# Patient Record
Sex: Female | Born: 1975
Health system: Southern US, Community
[De-identification: ages and names within clinical notes are randomized; demographics above are authoritative.]

## PROBLEM LIST (undated history)

## (undated) ENCOUNTER — Ambulatory Visit (HOSPITAL_COMMUNITY): Admission: EM | Payer: 59

## (undated) DIAGNOSIS — N926 Irregular menstruation, unspecified: Secondary | ICD-10-CM

## (undated) DIAGNOSIS — K5900 Constipation, unspecified: Secondary | ICD-10-CM

## (undated) DIAGNOSIS — D649 Anemia, unspecified: Secondary | ICD-10-CM

## (undated) DIAGNOSIS — Z789 Other specified health status: Secondary | ICD-10-CM

## (undated) HISTORY — PX: ABDOMINAL HYSTERECTOMY: SHX81

---

## 2004-07-13 ENCOUNTER — Emergency Department: Payer: Self-pay | Admitting: Emergency Medicine

## 2008-05-27 ENCOUNTER — Emergency Department: Payer: Self-pay | Admitting: Emergency Medicine

## 2012-02-24 ENCOUNTER — Emergency Department: Payer: Self-pay | Admitting: Emergency Medicine

## 2012-12-01 ENCOUNTER — Emergency Department: Payer: Self-pay

## 2015-01-23 ENCOUNTER — Ambulatory Visit
Admission: EM | Admit: 2015-01-23 | Discharge: 2015-01-23 | Discharge status: Absent without leave | Payer: BLUE CROSS/BLUE SHIELD

## 2015-03-23 ENCOUNTER — Ambulatory Visit
Admission: EM | Admit: 2015-03-23 | Discharge: 2015-03-23 | Disposition: A | Discharge status: Home / Self Care | Payer: BLUE CROSS/BLUE SHIELD | Attending: Family Medicine | Admitting: Family Medicine

## 2015-03-23 DIAGNOSIS — S39012A Strain of muscle, fascia and tendon of lower back, initial encounter: Secondary | ICD-10-CM

## 2015-03-23 HISTORY — DX: Other specified health status: Z78.9

## 2015-03-23 MED ORDER — CYCLOBENZAPRINE HCL 10 MG PO TABS: 10.0000 mg | ORAL_TABLET | Freq: Three times a day (TID) | ORAL | Status: DC | PRN

## 2015-03-23 NOTE — ED Notes (Signed)
Bilateral lower back pain since yesterday. Pt reports she took a generic OTC pain medication, with some relief. Pt denies urinary sx. Pt denies injury. Pt reports that the pain has gotten up to a 10, but is currently an 8/10.

## 2015-03-23 NOTE — ED Provider Notes (Signed)
CSN: [id number]     Arrival date & time 03/23/15  1856 History   First MD Initiated Contact with Patient 03/23/15 1929     Chief Complaint  Patient presents with  . Back Pain   (Consider location/radiation/quality/duration/timing/severity/associated sxs/prior Treatment) HPI Comments: 39 yo female with a 1 day h/o low back pain, bilateral but left greater than right. Denies any injuries, trauma, falls, fevers, chills, rash, dysuria, numbness/tingling.   The history is provided by the patient.    Past Medical History  Diagnosis Date  . Patient denies medical problems    Past Surgical History  Procedure Laterality Date  . Cesarean section      x 4   No family history on file. History  Substance Use Topics  . Smoking status: Current Every Day Smoker -- 0.25 packs/day for 2 years  . Smokeless tobacco: Never Used  . Alcohol Use: No   OB History    Gravida Para Term Preterm AB TAB SAB Ectopic Multiple Living   5 5             Review of Systems  Allergies  Review of patient's allergies indicates no known allergies.  Home Medications   Prior to Admission medications   Medication Sig Start Date End Date Taking? Authorizing Provider  cyclobenzaprine (FLEXERIL) 10 MG tablet Take 1 tablet (10 mg total) by mouth 3 (three) times daily as needed for muscle spasms. 03/23/15   Norval Gable, MD   BP 125/71 mmHg  Pulse 78  Temp(Src) 98.6 F (37 C) (Oral)  Resp 16  Ht 5\' 4"  (1.626 m)  Wt 175 lb (79.379 kg)  BMI 30.02 kg/m2  SpO2 100%  LMP 02/21/2015 (Approximate) Physical Exam  Constitutional: She appears well-developed and well-nourished. No distress.  Musculoskeletal: She exhibits tenderness. She exhibits no edema.       Lumbar back: She exhibits tenderness and spasm. She exhibits normal range of motion, no bony tenderness, no swelling, no edema, no deformity, no laceration, no pain and normal pulse.  Neurological: She is alert. She has normal reflexes. She exhibits normal  muscle tone.  Skin: Skin is warm and dry. No rash noted. She is not diaphoretic. No erythema.  Nursing note and vitals reviewed.   ED Course  Procedures (including critical care time) Labs Review Labs Reviewed  URINE CULTURE  URINALYSIS COMPLETEWITH MICROSCOPIC ([facility])    Imaging Review No results found.   MDM   1. Lumbar strain, initial encounter    Discharge Medication List as of 03/23/2015  7:47 PM    START taking these medications   Details  cyclobenzaprine (FLEXERIL) 10 MG tablet Take 1 tablet (10 mg total) by mouth 3 (three) times daily as needed for muscle spasms., Starting 03/23/2015, Until Discontinued, Normal      Plan: 1.  diagnosis reviewed with patient 2. rx as per orders; risks, benefits, potential side effects reviewed with patient 3. Recommend supportive treatment with rest, gentle stretching, ice/heat 4. F/u prn if symptoms worsen or don't improve    Norval Gable, MD 03/23/15 1949

## 2016-11-30 ENCOUNTER — Emergency Department (HOSPITAL_COMMUNITY)
Admission: EM | Admit: 2016-11-30 | Discharge: 2016-11-30 | Disposition: A | Discharge status: Home / Self Care | Payer: Self-pay | Attending: Emergency Medicine | Admitting: Emergency Medicine

## 2016-11-30 ENCOUNTER — Emergency Department (HOSPITAL_COMMUNITY): Payer: Self-pay

## 2016-11-30 ENCOUNTER — Encounter (HOSPITAL_COMMUNITY): Payer: Self-pay

## 2016-11-30 DIAGNOSIS — K5900 Constipation, unspecified: Secondary | ICD-10-CM | POA: Insufficient documentation

## 2016-11-30 DIAGNOSIS — Z87891 Personal history of nicotine dependence: Secondary | ICD-10-CM | POA: Insufficient documentation

## 2016-11-30 DIAGNOSIS — R140 Abdominal distension (gaseous): Secondary | ICD-10-CM | POA: Insufficient documentation

## 2016-11-30 LAB — URINALYSIS, ROUTINE W REFLEX MICROSCOPIC
Bilirubin Urine: NEGATIVE
Glucose, UA: NEGATIVE mg/dL
HGB URINE DIPSTICK: NEGATIVE
Ketones, ur: NEGATIVE mg/dL
LEUKOCYTES UA: NEGATIVE
NITRITE: NEGATIVE
PROTEIN: NEGATIVE mg/dL
SPECIFIC GRAVITY, URINE: 1.003 — AB (ref 1.005–1.030)
pH: 8 (ref 5.0–8.0)

## 2016-11-30 LAB — CBC WITH DIFFERENTIAL/PLATELET
BASOS PCT: 0 %
Basophils Absolute: 0 10*3/uL (ref 0.0–0.1)
EOS PCT: 2 %
Eosinophils Absolute: 0.1 10*3/uL (ref 0.0–0.7)
HCT: 34.9 % — ABNORMAL LOW (ref 36.0–46.0)
HEMOGLOBIN: 12 g/dL (ref 12.0–15.0)
LYMPHS PCT: 24 %
Lymphs Abs: 1.1 10*3/uL (ref 0.7–4.0)
MCH: 28.4 pg (ref 26.0–34.0)
MCHC: 34.4 g/dL (ref 30.0–36.0)
MCV: 82.5 fL (ref 78.0–100.0)
MONO ABS: 0.4 10*3/uL (ref 0.1–1.0)
Monocytes Relative: 9 %
Neutro Abs: 2.9 10*3/uL (ref 1.7–7.7)
Neutrophils Relative %: 65 %
Platelets: 131 10*3/uL — ABNORMAL LOW (ref 150–400)
RBC: 4.23 MIL/uL (ref 3.87–5.11)
RDW: 13 % (ref 11.5–15.5)
WBC: 4.6 10*3/uL (ref 4.0–10.5)

## 2016-11-30 LAB — COMPREHENSIVE METABOLIC PANEL
ALBUMIN: 3.4 g/dL — AB (ref 3.5–5.0)
ALK PHOS: 81 U/L (ref 38–126)
ALT: 93 U/L — ABNORMAL HIGH (ref 14–54)
AST: 116 U/L — ABNORMAL HIGH (ref 15–41)
Anion gap: 8 (ref 5–15)
BUN: 5 mg/dL — ABNORMAL LOW (ref 6–20)
CALCIUM: 8.6 mg/dL — AB (ref 8.9–10.3)
CHLORIDE: 106 mmol/L (ref 101–111)
CO2: 24 mmol/L (ref 22–32)
Creatinine, Ser: 0.61 mg/dL (ref 0.44–1.00)
GFR calc non Af Amer: >60 mL/min (ref >60)
GLUCOSE: 112 mg/dL — AB (ref 65–99)
POTASSIUM: 3.9 mmol/L (ref 3.5–5.1)
Sodium: 138 mmol/L (ref 135–145)
Total Bilirubin: 0.6 mg/dL (ref 0.3–1.2)
Total Protein: 6.4 g/dL — ABNORMAL LOW (ref 6.5–8.1)

## 2016-11-30 LAB — I-STAT BETA HCG BLOOD, ED (MC, WL, AP ONLY): I-stat hCG, quantitative: <5 m[IU]/mL (ref <5)

## 2016-11-30 LAB — LIPASE, BLOOD: Lipase: 16 U/L (ref 11–51)

## 2016-11-30 MED ORDER — POLYETHYLENE GLYCOL 3350 17 G PO PACK: 17.0000 g | PACK | Freq: Every day | ORAL | 0 refills | Status: DC

## 2016-11-30 MED ORDER — DOCUSATE SODIUM 250 MG PO CAPS: 250.0000 mg | ORAL_CAPSULE | Freq: Every day | ORAL | 0 refills | Status: DC

## 2016-11-30 MED ORDER — IOPAMIDOL (ISOVUE-300) INJECTION 61%
INTRAVENOUS | Status: AC
  Administered 2016-11-30: 100 mL
  Filled 2016-11-30: qty 100

## 2016-11-30 MED ORDER — MILK AND MOLASSES ENEMA: 1.0000 | Freq: Once | RECTAL | Status: DC

## 2016-11-30 NOTE — ED Provider Notes (Signed)
[facility] DEPT Provider Note   CSN: [id number] Arrival date & time: 11/30/16  0741     History   Chief Complaint Chief Complaint  Patient presents with  . Constipation    HPI Shelley Holden is a 41 y.o. female.  HPI Shelley Holden is a 41 y.o. female with hx of c-sections x4, no other medical problems, presents to ED with complaint of abdominal bloating and constipation. Pt states she has long standing history of constipation. States she has to take a laxative, which she takes once a week, to have a bowel movement. She normal takes 2 tablets of a laxative, name of which she cannot recall, states "some generic over the counter." This week she states she took 4 tablets 2 days ago, took 4 more last night, and drank a bottle of magnesium citrate with no relief. States this morning woke up with abdominal swelling, belching, burping. Denies any pain, states "just pressure." Admits to poor diet over last few weeks. States " I am constantly hungry and have been having a lot of cravings." not sure if she is pregnant. No fever, chills, urinary symptoms.   Past Medical History:  Diagnosis Date  . Patient denies medical problems     There are no active problems to display for this patient.   Past Surgical History:  Procedure Laterality Date  . CESAREAN SECTION     x 4    OB History    Gravida Para Term Preterm AB Living   5 5           SAB TAB Ectopic Multiple Live Births                   Home Medications    Prior to Admission medications   Medication Sig Start Date End Date Taking? Authorizing Provider  cyclobenzaprine (FLEXERIL) 10 MG tablet Take 1 tablet (10 mg total) by mouth 3 (three) times daily as needed for muscle spasms. 03/23/15   Norval Gable, MD    Family History History reviewed. No pertinent family history.  Social History Social History  Substance Use Topics  . Smoking status: Former Smoker    Packs/day: 0.25    Years: 2.00  . Smokeless tobacco: Never  Used  . Alcohol use No     Allergies   Patient has no known allergies.   Review of Systems Review of Systems  Constitutional: Negative for chills and fever.  Respiratory: Negative for cough, chest tightness and shortness of breath.   Cardiovascular: Negative for chest pain, palpitations and leg swelling.  Gastrointestinal: Positive for abdominal distention, abdominal pain and constipation. Negative for blood in stool, diarrhea, nausea and vomiting.  Genitourinary: Negative for dysuria, flank pain and pelvic pain.  Musculoskeletal: Negative for arthralgias, myalgias, neck pain and neck stiffness.  Skin: Negative for rash.  Neurological: Negative for dizziness, weakness and headaches.  All other systems reviewed and are negative.    Physical Exam Updated Vital Signs BP (!) 132/92 (BP Location: Right Arm)   Pulse 94   Temp 97.9 F (36.6 C) (Oral)   Resp 18   Ht 5\' 4"  (1.626 m)   Wt 86.6 kg   SpO2 98%   BMI 32.79 kg/m   Physical Exam  Constitutional: She appears well-developed and well-nourished. No distress.  HENT:  Head: Normocephalic.  Eyes: Conjunctivae are normal.  Neck: Neck supple.  Cardiovascular: Normal rate, regular rhythm and normal heart sounds.   Pulmonary/Chest: Effort normal and breath sounds normal. No respiratory  distress. She has no wheezes. She has no rales.  Abdominal: Soft. There is no tenderness. There is no rebound.  Bowels hyperactive, abdomen is distended but soft  Musculoskeletal: She exhibits no edema.  Neurological: She is alert.  Skin: Skin is warm and dry.  Psychiatric: She has a normal mood and affect. Her behavior is normal.  Nursing note and vitals reviewed.    ED Treatments / Results  Labs (all labs ordered are listed, but only abnormal results are displayed) Labs Reviewed  CBC WITH DIFFERENTIAL/PLATELET - Abnormal; Notable for the following:       Result Value   HCT 34.9 (*)    Platelets 131 (*)    All other components  within normal limits  COMPREHENSIVE METABOLIC PANEL - Abnormal; Notable for the following:    Glucose, Bld 112 (*)    BUN 5 (*)    Calcium 8.6 (*)    Total Protein 6.4 (*)    Albumin 3.4 (*)    AST 116 (*)    ALT 93 (*)    All other components within normal limits  URINALYSIS, ROUTINE W REFLEX MICROSCOPIC - Abnormal; Notable for the following:    Color, Urine COLORLESS (*)    Specific Gravity, Urine 1.003 (*)    All other components within normal limits  LIPASE, BLOOD  I-STAT BETA HCG BLOOD, ED ([facility], [facility], [facility] ONLY)    EKG  EKG Interpretation None       Radiology Ct Abdomen Pelvis W Contrast  Result Date: 11/30/2016 CLINICAL DATA:  Bloating and nausea. EXAM: CT ABDOMEN AND PELVIS WITH CONTRAST TECHNIQUE: Multidetector CT imaging of the abdomen and pelvis was performed using the standard protocol following bolus administration of intravenous contrast. CONTRAST:  160mL ISOVUE-300 IOPAMIDOL (ISOVUE-300) INJECTION 61% COMPARISON:  Abdominal radiographs November 30, 2016 FINDINGS: Lower chest: Lung bases are clear. Hepatobiliary: There is hepatic steatosis. No focal liver lesions are apparent. Gallbladder wall is not appreciably thickened. There is no biliary duct dilatation. Pancreas: No pancreatic mass or inflammatory focus. Spleen: No splenic lesions are evident. Adrenals/Urinary Tract: Adrenals appear normal. Kidneys bilaterally show no evidence of mass or hydronephrosis on either side. There is no renal or ureteral calculus on either side. Urinary bladder is midline with wall thickness within normal limits. Stomach/Bowel: There is moderate fluid and stool throughout the colon. Rectum is somewhat distended with stool and air. Rectal wall is not appreciably thickened. Transverse colon is borderline distended. There is no appreciable small bowel dilatation. There is no bowel wall or mesenteric thickening. There is no evident obstruction or free air. Vascular/Lymphatic: There is no abdominal aortic  aneurysm. No vascular lesions are evident. There is no adenopathy in the abdomen or pelvis. Reproductive: The uterus is anteverted and canted toward the left. The uterus is diffusely enlarged with several areas of enhancement consistent with leiomyomatous change. The largest individual enhancing lesion is seen in the anterior inferior uterus measuring 2.8 x 2.8 x 2.1 cm. There is a focal area of decreased attenuation near the junction of the cervix and uterus, likely a nabothian cyst. This presumed nabothian cyst measures 2 x 2 x 1.3 cm. No extrauterine pelvic mass evident. Other: Appendix appears normal. No ascites or abscess evident in the abdomen or pelvis. Musculoskeletal: No blastic or lytic bone lesions are evident. There is no intramuscular or abdominal wall lesion. IMPRESSION: Fluid and air throughout much of the colon. Rectum mildly distended with stool and air. Borderline colonic distention. Suspect a degree of colitis with  colonic ileus. Colonic wall is not thickened, however. There is no appreciable small bowel wall thickening or appreciable small bowel dilatation. No bowel obstruction is evident. No free air. Appendix appears normal.  No abscess. No renal or ureteral calculus.  No hydronephrosis. Hepatic steatosis without focal liver lesion. Uterus enlarged with apparent leiomyomatous change. Prominent chest and upper cervical nabothian cyst. Electronically Signed   By: Lowella Grip III M.D.   On: 11/30/2016 11:28   Dg Abd 2 Views  Result Date: 11/30/2016 CLINICAL DATA:  Patient has not had bowel movement in 1 week. Pressure on both sides of the abdomen. EXAM: ABDOMEN - 2 VIEW COMPARISON:  None. FINDINGS: Mildly distended abdomen with air-fluid levels in both the small bowel and colon. Rectal gas is present. There does not appear to be a large amount of stool in the colon. No abnormal calcifications are seen. Bones are unremarkable. IMPRESSION: Mildly distended abdomen with scattered air-fluid  levels could represent ileus. No definite obstruction or free air. There does not appear to a large stool burden. Electronically Signed   By: Staci Righter M.D.   On: 11/30/2016 09:36    Procedures Procedures (including critical care time)  Medications Ordered in ED Medications - No data to display   Initial Impression / Assessment and Plan / ED Course  I have reviewed the triage vital signs and the nursing notes.  Pertinent labs & imaging results that were available during my care of the patient were reviewed by me and considered in my medical decision making (see chart for details).     Pt in ED with abdominal distention, burping, belching, constipation, last bowel movement yesterday. Will check labs, get abd series to evaluate for gas pattern and constipation. Pregnancy test ordered.   9:53 AM X-ray showing dilated bowel loops and possible ileus. Patient continues to feel distended and states she is not passing gas. Reassessed with Dr. Rex Kras. Concerning for possible small bowel obstruction. Will get CT abdomen and pelvis for further evaluation.  11:52 AM CT scan with distended colon and stool in the rectum. Question ileus with colitis. Ordered enema, however when I went to reassess patient she stated that she went to the bathroom and had a very large bowel movement and feels much better. She is now passing gas through her rectum. Patient is tolerating oral fluids. Plan to discharge home with Colace and MiraLAX daily. Increase fiber, increase fluids. Follow-up as needed. Return precautions discussed.  Vitals:   11/30/16 0750 11/30/16 0815 11/30/16 0900 11/30/16 1001  BP: (!) 132/92 (!) 134/92 126/90 119/79  Pulse: 94 89 90 88  Resp: 18  18 16   Temp: 97.9 F (36.6 C)     TempSrc: Oral     SpO2: 98% 100% 99% 100%  Weight: 86.6 kg     Height: 5\' 4"  (1.626 m)        Final Clinical Impressions(s) / ED Diagnoses   Final diagnoses:  Abdominal distension  Constipation,  unspecified constipation type    New Prescriptions New Prescriptions   DOCUSATE SODIUM (COLACE) 250 MG CAPSULE    Take 1 capsule (250 mg total) by mouth daily.   POLYETHYLENE GLYCOL (MIRALAX) PACKET    Take 17 g by mouth daily.     Jeannett Senior, PA-C 11/30/16 [place], MD 11/30/16 [phone]

## 2016-11-30 NOTE — ED Notes (Signed)
Pt returned from CT °

## 2016-11-30 NOTE — Discharge Instructions (Signed)
Increase fiber and fluids in your diet. Take miralax and colace daily to regulate your bowel movements. Follow up with gastroenterologist.

## 2016-11-30 NOTE — ED Triage Notes (Signed)
Pt states she has hx of constipation. She reports constipation X2 days, using laxatives without improvement. Pt states she woke up this morning with abd pain and bloating.

## 2016-11-30 NOTE — ED Notes (Signed)
Patient transported to X-ray 

## 2016-11-30 NOTE — ED Notes (Signed)
MD at bedside. 

## 2017-03-25 ENCOUNTER — Emergency Department (HOSPITAL_COMMUNITY)
Admission: EM | Admit: 2017-03-25 | Discharge: 2017-03-25 | Disposition: A | Discharge status: Home / Self Care | Payer: No Typology Code available for payment source | Attending: Physician Assistant | Admitting: Physician Assistant

## 2017-03-25 ENCOUNTER — Encounter (HOSPITAL_COMMUNITY): Payer: Self-pay | Admitting: *Deleted

## 2017-03-25 ENCOUNTER — Emergency Department (HOSPITAL_COMMUNITY): Payer: No Typology Code available for payment source

## 2017-03-25 DIAGNOSIS — Y929 Unspecified place or not applicable: Secondary | ICD-10-CM | POA: Insufficient documentation

## 2017-03-25 DIAGNOSIS — Y9389 Activity, other specified: Secondary | ICD-10-CM | POA: Insufficient documentation

## 2017-03-25 DIAGNOSIS — M7918 Myalgia, other site: Secondary | ICD-10-CM

## 2017-03-25 DIAGNOSIS — M79642 Pain in left hand: Secondary | ICD-10-CM | POA: Diagnosis present

## 2017-03-25 DIAGNOSIS — M791 Myalgia: Secondary | ICD-10-CM | POA: Insufficient documentation

## 2017-03-25 DIAGNOSIS — Z79899 Other long term (current) drug therapy: Secondary | ICD-10-CM | POA: Diagnosis not present

## 2017-03-25 DIAGNOSIS — R51 Headache: Secondary | ICD-10-CM | POA: Insufficient documentation

## 2017-03-25 DIAGNOSIS — Y999 Unspecified external cause status: Secondary | ICD-10-CM | POA: Diagnosis not present

## 2017-03-25 MED ORDER — IBUPROFEN 400 MG PO TABS
600.0000 mg | ORAL_TABLET | Freq: Once | ORAL | Status: AC
  Administered 2017-03-25: 600 mg via ORAL
  Filled 2017-03-25: qty 1

## 2017-03-25 MED ORDER — IBUPROFEN 600 MG PO TABS: 600.0000 mg | ORAL_TABLET | Freq: Four times a day (QID) | ORAL | 0 refills | Status: DC | PRN

## 2017-03-25 MED ORDER — METHOCARBAMOL 500 MG PO TABS
500.0000 mg | ORAL_TABLET | Freq: Once | ORAL | Status: AC
  Administered 2017-03-25: 500 mg via ORAL
  Filled 2017-03-25: qty 1

## 2017-03-25 MED ORDER — METHOCARBAMOL 500 MG PO TABS: 500.0000 mg | ORAL_TABLET | Freq: Two times a day (BID) | ORAL | 0 refills | Status: DC

## 2017-03-25 NOTE — ED Notes (Signed)
PA at the bedside.
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
## 2018-11-16 ENCOUNTER — Ambulatory Visit (HOSPITAL_COMMUNITY)
Admission: EM | Admit: 2018-11-16 | Discharge: 2018-11-16 | Disposition: A | Discharge status: Home / Self Care | Payer: 59 | Attending: Emergency Medicine | Admitting: Emergency Medicine

## 2018-11-16 ENCOUNTER — Other Ambulatory Visit: Payer: Self-pay

## 2018-11-16 ENCOUNTER — Encounter (HOSPITAL_COMMUNITY): Payer: Self-pay

## 2018-11-16 DIAGNOSIS — B029 Zoster without complications: Secondary | ICD-10-CM

## 2018-11-16 MED ORDER — VALACYCLOVIR HCL 1 G PO TABS: 1000.0000 mg | ORAL_TABLET | Freq: Three times a day (TID) | ORAL | 0 refills | Status: AC

## 2018-11-16 MED ORDER — TETRACAINE HCL 0.5 % OP SOLN
OPHTHALMIC | Status: AC
  Filled 2018-11-16: qty 4

## 2018-11-16 MED ORDER — ERYTHROMYCIN 5 MG/GM OP OINT: TOPICAL_OINTMENT | OPHTHALMIC | 0 refills | Status: DC

## 2018-11-16 NOTE — ED Triage Notes (Signed)
Pt cc left eye swelling . Pt states she wear eyelashes and she dose not think that's the problem.

## 2018-11-16 NOTE — Discharge Instructions (Addendum)
Warm compresses.  Follow-up with Dr. Kathlen Mody tomorrow at 8 AM.  Call the office, [facility] to confirm your appointment tomorrow.  Phone number is [phone].

## 2018-11-16 NOTE — ED Provider Notes (Signed)
HPI  SUBJECTIVE:  Shelley Holden is a 43 y.o. female who presents with 1 week of left eye itching, 2 days of upper and lower eyelid swelling.  States that she wears false eyelashes on her upper lids, but has been using the same adhesive for the past 10 years without any problem.  She reports pain in the lateral corner of her left eye and discharge.  She reports a foreign body sensation.  No visual changes, eye pain, URI symptoms, headaches.  No other exposure to chemicals or new cosmetics.  No allergy symptoms.  She does not wear glasses or contacts.  No photophobia, facial rash, although she reports soreness, tenderness along her left cheek.  She has never had symptoms like this before.  She has tried warm compresses with improvement of symptoms.  No other aggravating or alleviating factors.  She has a past medical history of chickenpox, no history of shingles: None diabetes.  LMP: Irregular.  Denies the possibility being pregnant.  PMD: She has not yet established care with a primary care physician but plans on doing so    Past Medical History:  Diagnosis Date  . Patient denies medical problems     Past Surgical History:  Procedure Laterality Date  . CESAREAN SECTION     x 4    History reviewed. No pertinent family history.  Social History   Tobacco Use  . Smoking status: Former Smoker    Packs/day: 0.25    Years: 2.00    Pack years: 0.50  . Smokeless tobacco: Never Used  Substance Use Topics  . Alcohol use: No  . Drug use: No    No current facility-administered medications for this encounter.   Current Outpatient Medications:  .  erythromycin ophthalmic ointment, 1 cm ribbon to affected eyelid qid x 10 days, Disp: 5 g, Rfl: 0 .  valACYclovir (VALTREX) 1000 MG tablet, Take 1 tablet (1,000 mg total) by mouth 3 (three) times daily for 7 days., Disp: 21 tablet, Rfl: 0  No Known Allergies   ROS  As noted in HPI.   Physical Exam  Pulse 96   Temp 98.2 F (36.8 C) (Oral)    Resp 18   Wt 95.3 kg   SpO2 100%   BMI 36.05 kg/m   Constitutional: Well developed, well nourished, no acute distress Eyes: PERRLA, EOMI, conjunctiva normal bilaterally.  No direct or consensual photophobia.  Positive upper and lower eyelid swelling.  Positive tender grouped vesicular rash lateral corner lower eyelid with purulent drainage.  No dendrites seen on fluorescein exam.  No foreign body seen on magnification or lid eversion.      HENT: Normocephalic, atraumatic,mucus membranes moist Respiratory: Normal inspiratory effort Cardiovascular: Normal rate GI: nondistended skin: No other facial rash, but tenderness over the cheek.  Skin intact Musculoskeletal: no deformities Neurologic: Alert & oriented x 3, no focal neuro deficits Psychiatric: Speech and behavior appropriate   ED Course   Medications - No data to display  No orders of the defined types were placed in this encounter.   No results found for this or any previous visit (from the past 24 hour(s)). No results found.  ED Clinical Impression  Herpes zoster without complication   ED Assessment/Plan  Presentation concerning for shingles.  Discussed with Dr. Kathlen Mody, ophthalmologist on call, due to the proximity of the rash to the cornea.  There is no evidence of corneal involvement today.Marland Kitchen  He recommends erythromycin ointment in addition to Valtrex.  He will see  the patient tomorrow at 8 AM.  Discussed this with patient.  She states that she can go to the appointment.  Discussed  MDM, treatment plan, and plan for follow-up with patient. Discussed sn/sx that should prompt return to the ED. patient agrees with plan.   Meds ordered this encounter  Medications  . valACYclovir (VALTREX) 1000 MG tablet    Sig: Take 1 tablet (1,000 mg total) by mouth 3 (three) times daily for 7 days.    Dispense:  21 tablet    Refill:  0  . erythromycin ophthalmic ointment    Sig: 1 cm ribbon to affected eyelid qid x 10 days     Dispense:  5 g    Refill:  0    *This clinic note was created using [employer]. Therefore, there may be occasional mistakes despite careful proofreading.   ?    Melynda Ripple, MD 11/17/18 [phone]

## 2019-08-02 ENCOUNTER — Emergency Department (HOSPITAL_COMMUNITY): Payer: 59

## 2019-08-02 ENCOUNTER — Encounter (HOSPITAL_COMMUNITY): Payer: Self-pay | Admitting: Emergency Medicine

## 2019-08-02 ENCOUNTER — Emergency Department (HOSPITAL_COMMUNITY)
Admission: EM | Admit: 2019-08-02 | Discharge: 2019-08-02 | Disposition: A | Discharge status: Home / Self Care | Payer: 59 | Attending: Emergency Medicine | Admitting: Emergency Medicine

## 2019-08-02 DIAGNOSIS — S0990XA Unspecified injury of head, initial encounter: Secondary | ICD-10-CM | POA: Diagnosis not present

## 2019-08-02 DIAGNOSIS — Y999 Unspecified external cause status: Secondary | ICD-10-CM | POA: Insufficient documentation

## 2019-08-02 DIAGNOSIS — Y929 Unspecified place or not applicable: Secondary | ICD-10-CM | POA: Diagnosis not present

## 2019-08-02 DIAGNOSIS — Y939 Activity, unspecified: Secondary | ICD-10-CM | POA: Diagnosis not present

## 2019-08-02 DIAGNOSIS — S161XXA Strain of muscle, fascia and tendon at neck level, initial encounter: Secondary | ICD-10-CM | POA: Diagnosis not present

## 2019-08-02 DIAGNOSIS — R519 Headache, unspecified: Secondary | ICD-10-CM | POA: Diagnosis present

## 2019-08-02 DIAGNOSIS — Z87891 Personal history of nicotine dependence: Secondary | ICD-10-CM | POA: Diagnosis not present

## 2019-08-02 MED ORDER — METHOCARBAMOL 500 MG PO TABS: 500.0000 mg | ORAL_TABLET | Freq: Three times a day (TID) | ORAL | 0 refills | Status: DC | PRN

## 2019-08-02 MED ORDER — NAPROXEN 500 MG PO TABS: 500.0000 mg | ORAL_TABLET | Freq: Two times a day (BID) | ORAL | 0 refills | Status: DC | PRN

## 2019-08-02 MED ORDER — DICLOFENAC SODIUM 1 % EX GEL: 2.0000 g | Freq: Four times a day (QID) | CUTANEOUS | 0 refills | Status: DC | PRN
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
## 2019-08-02 NOTE — ED Notes (Signed)
Pt verbalized understanding of discharge instructions. Follow up care and prescriptions reviewed, pt had no further questions. 

## 2019-08-02 NOTE — ED Notes (Signed)
Patient transported to CT 
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
## 2020-12-03 ENCOUNTER — Other Ambulatory Visit: Payer: Self-pay

## 2020-12-03 ENCOUNTER — Emergency Department (HOSPITAL_COMMUNITY)
Admission: EM | Admit: 2020-12-03 | Discharge: 2020-12-03 | Disposition: A | Discharge status: Home / Self Care | Payer: 59 | Attending: Emergency Medicine | Admitting: Emergency Medicine

## 2020-12-03 ENCOUNTER — Encounter (HOSPITAL_COMMUNITY): Payer: Self-pay

## 2020-12-03 DIAGNOSIS — Z87891 Personal history of nicotine dependence: Secondary | ICD-10-CM | POA: Insufficient documentation

## 2020-12-03 DIAGNOSIS — N938 Other specified abnormal uterine and vaginal bleeding: Secondary | ICD-10-CM | POA: Diagnosis not present

## 2020-12-03 DIAGNOSIS — D500 Iron deficiency anemia secondary to blood loss (chronic): Secondary | ICD-10-CM | POA: Insufficient documentation

## 2020-12-03 DIAGNOSIS — N939 Abnormal uterine and vaginal bleeding, unspecified: Secondary | ICD-10-CM | POA: Diagnosis present

## 2020-12-03 LAB — CBC
HCT: 30.2 % — ABNORMAL LOW (ref 36.0–46.0)
Hemoglobin: 9.1 g/dL — ABNORMAL LOW (ref 12.0–15.0)
MCH: 23.6 pg — ABNORMAL LOW (ref 26.0–34.0)
MCHC: 30.1 g/dL (ref 30.0–36.0)
MCV: 78.4 fL — ABNORMAL LOW (ref 80.0–100.0)
Platelets: 208 10*3/uL (ref 150–400)
RBC: 3.85 MIL/uL — ABNORMAL LOW (ref 3.87–5.11)
RDW: 15.4 % (ref 11.5–15.5)
WBC: 5.8 10*3/uL (ref 4.0–10.5)
nRBC: 0 % (ref 0.0–0.2)

## 2020-12-03 LAB — BASIC METABOLIC PANEL
Anion gap: 7 (ref 5–15)
BUN: 6 mg/dL (ref 6–20)
CO2: 23 mmol/L (ref 22–32)
Calcium: 8.5 mg/dL — ABNORMAL LOW (ref 8.9–10.3)
Chloride: 109 mmol/L (ref 98–111)
Creatinine, Ser: 0.67 mg/dL (ref 0.44–1.00)
GFR, Estimated: >60 mL/min (ref >60)
Glucose, Bld: 82 mg/dL (ref 70–99)
Potassium: 3.8 mmol/L (ref 3.5–5.1)
Sodium: 139 mmol/L (ref 135–145)

## 2020-12-03 LAB — I-STAT BETA HCG BLOOD, ED (MC, WL, AP ONLY): I-stat hCG, quantitative: <5 m[IU]/mL (ref <5)

## 2020-12-03 MED ORDER — MEDROXYPROGESTERONE ACETATE 10 MG PO TABS: 10.0000 mg | ORAL_TABLET | Freq: Every day | ORAL | 0 refills | Status: DC

## 2020-12-03 MED ORDER — NAPROXEN 500 MG PO TABS: 500.0000 mg | ORAL_TABLET | Freq: Two times a day (BID) | ORAL | 0 refills | Status: DC

## 2020-12-03 MED ORDER — FERROUS SULFATE 325 (65 FE) MG PO TABS: 325.0000 mg | ORAL_TABLET | Freq: Every day | ORAL | 0 refills | Status: DC

## 2020-12-03 NOTE — ED Notes (Signed)
Pt couldn't complete orthostatic vitals standing up pt felt dizzy and nauseous

## 2020-12-03 NOTE — Discharge Instructions (Signed)
Please go to your appointment OB/GYN tomorrow, as scheduled.  You have anemia with hemoglobin of 9.1, down from 12.0 in labs obtained 4 years ago.  Suspect that this is related to chronic blood loss due to your irregular and heavy menses.  You uterus was palpable and I am concerned that there may be a structural cause for your recent heavy bleeding.  You may need ultrasound for further delineation.  Please take the ferrous sulfate, Provera, and naproxen, as prescribed.  Return to the ED or seek immediate medical attention should you experience any new or worsening symptoms.

## 2020-12-03 NOTE — ED Triage Notes (Signed)
Patient reports that she has had vaginal bleeding x 2 weeks with clots.

## 2020-12-03 NOTE — ED Provider Notes (Signed)
[facility] DEPT Provider Note   CSN: [id number] Arrival date & time: 12/03/20  1224     History Chief Complaint  Patient presents with  . Vaginal Bleeding    Shelley Holden is a 45 y.o. female with no relevant past medical history presents the ED with a 2-week history of vaginal bleeding.  On my examination, patient reports that she has a history of irregular menses ever since she had an IUD removed 5 years ago.  However, she has never had heavy bleeding to this extent.  She began with light bleeding a couple weeks ago, but has been progressively worsening with heavy menses x1 week.  She states that she feels mildly lightheaded upon standing and generally fatigued.  Denies any history of prior.  She states that she has already managed to schedule appointment with OB/GYN for tomorrow at 3 PM with [facility].  She denies any recent illness or infection, fevers or chills, abdominal pain, melena or hematochezia, dyspareunia, postcoital bleeding, vaginal discharge, or other symptoms.  She has been married x7 years, low suspicion for STI.  HPI     Past Medical History:  Diagnosis Date  . Patient denies medical problems     There are no problems to display for this patient.   Past Surgical History:  Procedure Laterality Date  . CESAREAN SECTION     x 4     OB History    Gravida  5   Para  5   Term      Preterm      AB      Living        SAB      IAB      Ectopic      Multiple      Live Births              Family History  Problem Relation Age of Onset  . Cancer Father     Social History   Tobacco Use  . Smoking status: Former Smoker    Packs/day: 0.25    Years: 2.00    Pack years: 0.50    Types: Cigarettes  . Smokeless tobacco: Never Used  Vaping Use  . Vaping Use: Every day  . Substances: Flavoring  Substance Use Topics  . Alcohol use: No  . Drug use: No    Home Medications Prior to  Admission medications   Medication Sig Start Date End Date Taking? Authorizing Provider  ferrous sulfate 325 (65 FE) MG tablet Take 1 tablet (325 mg total) by mouth daily. 12/03/20  Yes Corena Herter, PA-C  medroxyPROGESTERone (PROVERA) 10 MG tablet Take 1 tablet (10 mg total) by mouth daily. 12/03/20  Yes Corena Herter, PA-C  naproxen (NAPROSYN) 500 MG tablet Take 1 tablet (500 mg total) by mouth 2 (two) times daily. 12/03/20  Yes Corena Herter, PA-C  diclofenac Sodium (VOLTAREN) 1 % GEL Apply 2 g topically 4 (four) times daily as needed. 08/02/19   Long, Wonda Olds, MD  erythromycin ophthalmic ointment 1 cm ribbon to affected eyelid qid x 10 days 11/16/18   Melynda Ripple, MD  methocarbamol (ROBAXIN) 500 MG tablet Take 1 tablet (500 mg total) by mouth every 8 (eight) hours as needed for muscle spasms. 08/02/19   Long, Wonda Olds, MD    Allergies    Patient has no known allergies.  Review of Systems   Review of Systems  All other systems reviewed and are  negative.   Physical Exam Updated Vital Signs BP (!) 150/89   Pulse 93   Temp 98.9 F (37.2 C) (Oral)   Resp 20   Ht 5\' 4"  (1.626 m)   Wt 95.3 kg   LMP 12/03/2020   SpO2 100%   BMI 36.05 kg/m   Physical Exam Vitals and nursing note reviewed. Exam conducted with a chaperone present.  Constitutional:      General: She is not in acute distress.    Appearance: She is not toxic-appearing.  HENT:     Head: Normocephalic and atraumatic.  Eyes:     General: No scleral icterus.    Conjunctiva/sclera: Conjunctivae normal.     Comments: No significant pallor.  Cardiovascular:     Rate and Rhythm: Normal rate.     Pulses: Normal pulses.     Comments: Normal rate on my exam. Pulmonary:     Effort: Pulmonary effort is normal. No respiratory distress.  Abdominal:     General: Abdomen is flat. There is no distension.     Palpations: Abdomen is soft.     Tenderness: There is no abdominal tenderness. There is no guarding.      Comments: Soft, nondistended.  Mildly enlarged and palpable uterus.  No tenderness.  No overlying skin changes.  Genitourinary:    Comments: Patient deferred due to OB/GYN appointment tomorrow. Skin:    General: Skin is dry.     Coloration: Skin is not pale.  Neurological:     Mental Status: She is alert and oriented to person, place, and time.     GCS: GCS eye subscore is 4. GCS verbal subscore is 5. GCS motor subscore is 6.  Psychiatric:        Mood and Affect: Mood normal.        Behavior: Behavior normal.        Thought Content: Thought content normal.     ED Results / Procedures / Treatments   Labs (all labs ordered are listed, but only abnormal results are displayed) Labs Reviewed  CBC - Abnormal; Notable for the following components:      Result Value   RBC 3.85 (*)    Hemoglobin 9.1 (*)    HCT 30.2 (*)    MCV 78.4 (*)    MCH 23.6 (*)    All other components within normal limits  BASIC METABOLIC PANEL - Abnormal; Notable for the following components:   Calcium 8.5 (*)    All other components within normal limits  I-STAT BETA HCG BLOOD, ED ([facility], [facility], [facility] ONLY)    EKG None  Radiology No results found.  Procedures Procedures   Medications Ordered in ED Medications - No data to display  ED Course  I have reviewed the triage vital signs and the nursing notes.  Pertinent labs & imaging results that were available during my care of the patient were reviewed by me and considered in my medical decision making (see chart for details).    MDM Rules/Calculators/A&P                          Shelley Holden was evaluated in Emergency Department on 12/03/2020 for the symptoms described in the history of present illness. She was evaluated in the context of the global COVID-19 pandemic, which necessitated consideration that the patient might be at risk for infection with the SARS-CoV-2 virus that causes COVID-19. Institutional protocols and algorithms that pertain to the  evaluation  of patients at risk for COVID-19 are in a state of rapid change based on information released by regulatory bodies including the CDC and federal and state organizations. These policies and algorithms were followed during the patient's care in the ED.  I personally reviewed patient's medical chart and all notes from triage and staff during today's encounter. I have also ordered and reviewed all labs and imaging that I felt to be medically necessary in the evaluation of this patient's complaints and with consideration of their physical exam. If needed, translation services were available and utilized.    Unclear as to whether or not her dysfunctional uterine bleeding is hormonal or due to structural abnormalities such as fibroids or adenomyosis.  There was a mildly enlarged palpable uterus on my abdominal exam.  No peritoneal signs.  She denied any associated tenderness or complaints of abdominal pain.  She also denies any postcoital bleeding or dyspareunia, low suspicion for traumatic laceration.  She states that she is in a committed relationship x7 years and has low suspicion for STI.  Denies any recent pelvic discharge.  She denies any personal family history of coagulopathies and there is no petechiae or pallor on my physical exam.  Will obtain CBC and BMP in addition to orthostatic vital signs given that she reports lightheadedness upon standing.  She declines pelvic exam here in the ED given that she just found out that she has an appointment with OB/GYN tomorrow at 3 PM with [facility].  I considered pelvic ultrasound here in the ED to evaluate for structural cause of her dysfunctional uterine bleeding, but she states that she can simply wait until she sees OB/GYN tomorrow.  Will treat with Provera 10 mg daily x10 to 14 days as well as Naproxen 500 mg BID as NSAIDs have been shown to decrease cramping and bleeding by 50%.  Patient with anemia with hemoglobin of 9.1,  representing a three-point drop when compared to labs obtained a few years ago.  This is possibly representative of symptomatic anemia, however patient has excellent outpatient follow-up and is not a transfusion candidate.  Microcytic anemia with MCV of 78.4, likely iron deficient due to blood loss.  She is hemodynamically stable.  Good outpatient follow-up.  Strict ED return precautions discussed.  All of the evaluation and work-up results were discussed with the patient and any family at bedside. They were provided opportunity to ask any additional questions and have none at this time. They have expressed understanding of verbal discharge instructions as well as return precautions and are agreeable to the plan.    Final Clinical Impression(s) / ED Diagnoses Final diagnoses:  Dysfunctional uterine bleeding  Iron deficiency anemia due to chronic blood loss    Rx / DC Orders ED Discharge Orders         Ordered    naproxen (NAPROSYN) 500 MG tablet  2 times daily        12/03/20 1629    medroxyPROGESTERone (PROVERA) 10 MG tablet  Daily        12/03/20 1629    ferrous sulfate 325 (65 FE) MG tablet  Daily        12/03/20 1629           Reita Chard 12/03/20 1631    Carmin Muskrat, MD 12/04/20 2354

## 2020-12-18 ENCOUNTER — Encounter (HOSPITAL_BASED_OUTPATIENT_CLINIC_OR_DEPARTMENT_OTHER): Payer: Self-pay | Admitting: Obstetrics and Gynecology

## 2020-12-18 ENCOUNTER — Other Ambulatory Visit: Payer: Self-pay

## 2020-12-18 NOTE — Progress Notes (Signed)
Spoke w/ via phone for pre-op interview---pt Lab needs dos----t & s, urine preg               Lab results------none COVID test -----12-21-2020 845 am Arrive at -------530 am 12-25-2020 NPO after MN NO Solid Food.  Clear liquids from MN until---430 am then npo Med rec completed Medications to take morning of surgery -----none Diabetic medication -----n/a Patient instructed to bring photo id and insurance card day of surgery Patient aware to have Driver (ride ) / caregiver spouse Larene Beach will drop pt off cell [phone]   for 24 hours after surgery  Patient Special Instructions -----none Pre-Op special Istructions -----none Patient verbalized understanding of instructions that were given at this phone interview. Patient denies shortness of breath, chest pain, fever, cough at this phone interview.

## 2020-12-21 ENCOUNTER — Other Ambulatory Visit (HOSPITAL_COMMUNITY)
Admission: RE | Admit: 2020-12-21 | Discharge: 2020-12-21 | Disposition: A | Discharge status: Home / Self Care | Payer: 59 | Source: Ambulatory Visit | Attending: Obstetrics and Gynecology | Admitting: Obstetrics and Gynecology

## 2020-12-21 DIAGNOSIS — Z20822 Contact with and (suspected) exposure to covid-19: Secondary | ICD-10-CM | POA: Insufficient documentation

## 2020-12-21 DIAGNOSIS — Z01812 Encounter for preprocedural laboratory examination: Secondary | ICD-10-CM | POA: Insufficient documentation

## 2020-12-21 LAB — SARS CORONAVIRUS 2 (TAT 6-24 HRS): SARS Coronavirus 2: NEGATIVE

## 2020-12-24 NOTE — Anesthesia Preprocedure Evaluation (Addendum)
Anesthesia Evaluation  Patient identified by MRN, date of birth, ID band Patient awake    Reviewed: Allergy & Precautions, NPO status , Patient's Chart, lab work & pertinent test results  Airway Mallampati: II  TM Distance: >3 FB Neck ROM: Full    Dental  (+) Teeth Intact   Pulmonary neg pulmonary ROS, former smoker,    Pulmonary exam normal        Cardiovascular negative cardio ROS   Rhythm:Regular Rate:Normal     Neuro/Psych negative neurological ROS  negative psych ROS   GI/Hepatic negative GI ROS, Neg liver ROS,   Endo/Other  negative endocrine ROS  Renal/GU negative Renal ROS  Female GU complaint AUB    Musculoskeletal negative musculoskeletal ROS (+)   Abdominal (+)  Abdomen: soft. Bowel sounds: normal.  Peds  Hematology  (+) anemia ,   Anesthesia Other Findings   Reproductive/Obstetrics negative OB ROS                            Anesthesia Physical Anesthesia Plan  ASA: II  Anesthesia Plan: General   Post-op Pain Management:    Induction: Intravenous  PONV Risk Score and Plan: 3 and Ondansetron, Dexamethasone, Midazolam and Treatment may vary due to age or medical condition  Airway Management Planned: Mask and LMA  Additional Equipment: None  Intra-op Plan:   Post-operative Plan: Extubation in OR  Informed Consent: I have reviewed the patients History and Physical, chart, labs and discussed the procedure including the risks, benefits and alternatives for the proposed anesthesia with the patient or authorized representative who has indicated his/her understanding and acceptance.     Dental advisory given  Plan Discussed with:   Anesthesia Plan Comments: (Lab Results      Component                Value               Date                      WBC                      5.8                 12/03/2020                HGB                      9.1 (L)              12/03/2020                HCT                      30.2 (L)            12/03/2020                MCV                      78.4 (L)            12/03/2020                PLT                      208  12/03/2020           Lab Results      Component                Value               Date                      NA                       139                 12/03/2020                K                        3.8                 12/03/2020                CO2                      23                  12/03/2020                GLUCOSE                  82                  12/03/2020                BUN                      6                   12/03/2020                CREATININE               0.67                12/03/2020                CALCIUM                  8.5 (L)             12/03/2020                GFRNONAA                 >60                 12/03/2020                GFRAA                    >60                 11/30/2016          )       Anesthesia Quick Evaluation

## 2020-12-24 NOTE — H&P (Signed)
Shelley Holden is an 45 y.o. female presenting for scheduled surgery  Pertinent Gynecological History: Menses: flow is excessive with use of 5-6 pads or tampons on heaviest days Bleeding: dysfunctional uterine bleeding Contraception: abstinence DES exposure: denies Blood transfusions: none Sexually transmitted diseases: no past history Previous GYN Procedures: none  Last mammogram: never had Last pap: normal Date: 12/10/20 OB History: G5, P5006   Menstrual History: Menarche age: early teens Patient's last menstrual period was 11/06/2020.    Past Medical History:  Diagnosis Date  . Anemia   . Constipation   . Irregular bleeding     Past Surgical History:  Procedure Laterality Date  . CESAREAN SECTION     x 4    Family History  Problem Relation Age of Onset  . Cancer Father     Social History:  reports that she has quit smoking. Her smoking use included cigarettes. She has a 0.50 pack-year smoking history. She has never used smokeless tobacco. She reports that she does not drink alcohol and does not use drugs.  Allergies: No Known Allergies  No medications prior to admission.    Review of Systems  Constitutional: Negative for chills and fever.  Respiratory: Negative for shortness of breath.   Cardiovascular: Negative for chest pain, palpitations and leg swelling.  Gastrointestinal: Negative for abdominal pain, nausea and vomiting.  Genitourinary: Positive for pelvic pain and vaginal bleeding.  Neurological: Negative for dizziness, weakness and headaches.  Psychiatric/Behavioral: Negative for suicidal ideas.    Height 5\' 4"  (1.626 m), weight 93.9 kg, last menstrual period 11/06/2020. Physical Exam *Constitutional General Appearance: healthy-appearing, overweight  *Cardiovascular Auscultation: RRR, no murmur Peripheral Vascular: no edema, pedal pulses intact  *Respiratory Auscultation: clear to auscultation, no wheezing, no rales/crackles, no  rhonchi Inspection: normal, normal respiratory rate  *Gastrointestinal Inspection/Palpation/Auscultation: non-distended, no tenderness, no rebound, no guarding, soft, normal bowel sounds, abdomen: incision Pfannenstiel, uterus palpated __ weeks (two clear incisions, palpable scarring beneath but well-healed epidermis. Mildly tender fundus suprapubic (10-11 wks))  *Musculoskeletal Legs: no palpable cords, no calf tenderness  *Skin Appearance: no rashes, no lesions  *Neurological System Impressions: motor: no deficits, sensory: no deficits  *Psychiatric Mood and Affect: active and alert No results found for this or any previous visit (from the past 24 hour(s)).  No results found.    Assessment/Plan: [id number] presents for scheduled Hysteroscopy D&C w/ Myosure and Mirena IUD placement. H/o csx x2, anemia 2/2 AUB, BMI 36, otherwise uncomplicated history -TVUS 3/29 c/w adenomyosis and possible endometrial polyp -Discussed surgical options of hysteroscopy D&C Myosure with mirena placement vs hysterectomy. In end, patient elects for hysteroscopy D&C w/ Myosure followed by Mirena IUD placement  The patient was informed of the risks and benefits of a hysteroscopy with dilation and curettage. Risks included but were not limited to bleeding, infections, injury to the vulva, vagina or cervix, or uterine perforation. If concern for latter, may proceed with diagnostic laparoscopy for evaluation of injury which may require surgical repair. Patient understands and is amenable.    Melida Quitter Shelley Holden 12/24/2020, 4:26 PM

## 2020-12-25 ENCOUNTER — Ambulatory Visit (HOSPITAL_BASED_OUTPATIENT_CLINIC_OR_DEPARTMENT_OTHER)
Admission: RE | Admit: 2020-12-25 | Discharge: 2020-12-25 | Disposition: A | Discharge status: Home / Self Care | Payer: 59 | Attending: Obstetrics and Gynecology | Admitting: Obstetrics and Gynecology

## 2020-12-25 ENCOUNTER — Encounter (HOSPITAL_BASED_OUTPATIENT_CLINIC_OR_DEPARTMENT_OTHER)
Admission: RE | Disposition: A | Discharge status: Home / Self Care | Payer: Self-pay | Source: Home / Self Care | Attending: Obstetrics and Gynecology

## 2020-12-25 ENCOUNTER — Ambulatory Visit (HOSPITAL_BASED_OUTPATIENT_CLINIC_OR_DEPARTMENT_OTHER): Payer: 59 | Admitting: Anesthesiology

## 2020-12-25 ENCOUNTER — Other Ambulatory Visit: Payer: Self-pay

## 2020-12-25 ENCOUNTER — Encounter (HOSPITAL_BASED_OUTPATIENT_CLINIC_OR_DEPARTMENT_OTHER): Payer: Self-pay | Admitting: Obstetrics and Gynecology

## 2020-12-25 DIAGNOSIS — N939 Abnormal uterine and vaginal bleeding, unspecified: Secondary | ICD-10-CM | POA: Insufficient documentation

## 2020-12-25 DIAGNOSIS — N840 Polyp of corpus uteri: Secondary | ICD-10-CM | POA: Diagnosis not present

## 2020-12-25 DIAGNOSIS — Z87891 Personal history of nicotine dependence: Secondary | ICD-10-CM | POA: Insufficient documentation

## 2020-12-25 HISTORY — DX: Irregular menstruation, unspecified: N92.6

## 2020-12-25 HISTORY — DX: Anemia, unspecified: D64.9

## 2020-12-25 HISTORY — DX: Constipation, unspecified: K59.00

## 2020-12-25 HISTORY — PX: DILATATION & CURETTAGE/HYSTEROSCOPY WITH MYOSURE: SHX6511

## 2020-12-25 HISTORY — PX: INTRAUTERINE DEVICE (IUD) INSERTION: SHX5877

## 2020-12-25 LAB — TYPE AND SCREEN
ABO/RH(D): B POS
Antibody Screen: NEGATIVE

## 2020-12-25 LAB — ABO/RH: ABO/RH(D): B POS

## 2020-12-25 LAB — POCT PREGNANCY, URINE: Preg Test, Ur: NEGATIVE

## 2020-12-25 SURGERY — DILATATION & CURETTAGE/HYSTEROSCOPY WITH MYOSURE
Anesthesia: General

## 2020-12-25 MED ORDER — FENTANYL CITRATE (PF) 100 MCG/2ML IJ SOLN
INTRAMUSCULAR | Status: AC
  Filled 2020-12-25: qty 2

## 2020-12-25 MED ORDER — DEXAMETHASONE SODIUM PHOSPHATE 10 MG/ML IJ SOLN
INTRAMUSCULAR | Status: AC
  Filled 2020-12-25: qty 1

## 2020-12-25 MED ORDER — SCOPOLAMINE 1 MG/3DAYS TD PT72
MEDICATED_PATCH | TRANSDERMAL | Status: DC | PRN
  Administered 2020-12-25: 1 via TRANSDERMAL

## 2020-12-25 MED ORDER — LIDOCAINE 2% (20 MG/ML) 5 ML SYRINGE
INTRAMUSCULAR | Status: DC | PRN
  Administered 2020-12-25: 100 mg via INTRAVENOUS

## 2020-12-25 MED ORDER — ONDANSETRON HCL 4 MG/2ML IJ SOLN
INTRAMUSCULAR | Status: AC
  Filled 2020-12-25: qty 2

## 2020-12-25 MED ORDER — PROPOFOL 10 MG/ML IV BOLUS
INTRAVENOUS | Status: AC
  Filled 2020-12-25: qty 40

## 2020-12-25 MED ORDER — MIDAZOLAM HCL 2 MG/2ML IJ SOLN
INTRAMUSCULAR | Status: AC
  Filled 2020-12-25: qty 2

## 2020-12-25 MED ORDER — MIDAZOLAM HCL 5 MG/5ML IJ SOLN
INTRAMUSCULAR | Status: DC | PRN
  Administered 2020-12-25: 2 mg via INTRAVENOUS

## 2020-12-25 MED ORDER — SODIUM CHLORIDE 0.9 % IR SOLN
Status: DC | PRN
  Administered 2020-12-25: 3000 mL

## 2020-12-25 MED ORDER — KETOROLAC TROMETHAMINE 30 MG/ML IJ SOLN
INTRAMUSCULAR | Status: AC
  Filled 2020-12-25: qty 1

## 2020-12-25 MED ORDER — LACTATED RINGERS IV SOLN
INTRAVENOUS | Status: DC
  Administered 2020-12-25: 1000 mL via INTRAVENOUS

## 2020-12-25 MED ORDER — FENTANYL CITRATE (PF) 100 MCG/2ML IJ SOLN: 25.0000 ug | INTRAMUSCULAR | Status: DC | PRN

## 2020-12-25 MED ORDER — IBUPROFEN 800 MG PO TABS: 800.0000 mg | ORAL_TABLET | Freq: Three times a day (TID) | ORAL | 0 refills | Status: DC | PRN

## 2020-12-25 MED ORDER — PROMETHAZINE HCL 25 MG/ML IJ SOLN: 6.2500 mg | INTRAMUSCULAR | Status: DC | PRN

## 2020-12-25 MED ORDER — SCOPOLAMINE 1 MG/3DAYS TD PT72
MEDICATED_PATCH | TRANSDERMAL | Status: AC
  Filled 2020-12-25: qty 1

## 2020-12-25 MED ORDER — FENTANYL CITRATE (PF) 100 MCG/2ML IJ SOLN
INTRAMUSCULAR | Status: DC | PRN
  Administered 2020-12-25: 50 ug via INTRAVENOUS

## 2020-12-25 MED ORDER — DEXAMETHASONE SODIUM PHOSPHATE 10 MG/ML IJ SOLN
INTRAMUSCULAR | Status: DC | PRN
  Administered 2020-12-25: 8 mg via INTRAVENOUS

## 2020-12-25 MED ORDER — ONDANSETRON HCL 4 MG/2ML IJ SOLN
INTRAMUSCULAR | Status: DC | PRN
  Administered 2020-12-25: 4 mg via INTRAVENOUS

## 2020-12-25 MED ORDER — PROPOFOL 10 MG/ML IV BOLUS
INTRAVENOUS | Status: DC | PRN
  Administered 2020-12-25: 200 mg via INTRAVENOUS

## 2020-12-25 MED ORDER — ACETAMINOPHEN 10 MG/ML IV SOLN: 1000.0000 mg | Freq: Once | INTRAVENOUS | Status: DC | PRN

## 2020-12-25 MED ORDER — LIDOCAINE 2% (20 MG/ML) 5 ML SYRINGE
INTRAMUSCULAR | Status: AC
  Filled 2020-12-25: qty 5

## 2020-12-25 MED ORDER — LACTATED RINGERS IV SOLN: INTRAVENOUS | Status: DC

## 2020-12-25 MED ORDER — LIDOCAINE HCL 1 % IJ SOLN
INTRAMUSCULAR | Status: DC | PRN
  Administered 2020-12-25: 10 mL

## 2020-12-25 MED ORDER — LEVONORGESTREL 20 MCG/24HR IU IUD
INTRAUTERINE_SYSTEM | INTRAUTERINE | Status: AC
  Administered 2020-12-25: 1 via INTRAUTERINE

## 2020-12-25 MED ORDER — KETOROLAC TROMETHAMINE 30 MG/ML IJ SOLN
INTRAMUSCULAR | Status: DC | PRN
  Administered 2020-12-25: 30 mg via INTRAVENOUS

## 2020-12-25 SURGICAL SUPPLY — 12 items
CATH ROBINSON RED A/P 16FR (CATHETERS) ×2 IMPLANT
DEVICE MYOSURE LITE (MISCELLANEOUS) IMPLANT
DEVICE MYOSURE REACH (MISCELLANEOUS) ×2 IMPLANT
GLOVE SURG LTX SZ6.5 (GLOVE) ×2 IMPLANT
GLOVE SURG UNDER POLY LF SZ6.5 (GLOVE) ×2 IMPLANT
GOWN STRL REUS W/TWL LRG LVL3 (GOWN DISPOSABLE) ×4 IMPLANT
KIT PROCEDURE FLUENT (KITS) ×2 IMPLANT
KIT TURNOVER CYSTO (KITS) ×2 IMPLANT
PACK VAGINAL MINOR WOMEN LF (CUSTOM PROCEDURE TRAY) ×2 IMPLANT
PAD OB MATERNITY 4.3X12.25 (PERSONAL CARE ITEMS) ×2 IMPLANT
SEAL ROD LENS SCOPE MYOSURE (ABLATOR) ×2 IMPLANT
TOWEL OR 17X26 10 PK STRL BLUE (TOWEL DISPOSABLE) ×2 IMPLANT

## 2020-12-25 NOTE — Interval H&P Note (Signed)
History and Physical Interval Note:  12/25/2020 7:30 AM  Shelley Holden  has presented today for surgery, with the diagnosis of irregular bleeding.  The various methods of treatment have been discussed with the patient and family. After consideration of risks, benefits and other options for treatment, the patient has consented to  Procedure(s): [place] (N/A) INTRAUTERINE DEVICE (IUD) INSERTION, MIRENA (N/A) as a surgical intervention.  The patient's history has been reviewed, patient examined, no change in status, stable for surgery.  I have reviewed the patient's chart and labs.  Questions were answered to the patient's satisfaction.    No changes from H&P. Vaginal bleeding stopped 4 days ago. No complaints today  Melida Quitter [name]

## 2020-12-25 NOTE — Discharge Instructions (Signed)

## 2020-12-25 NOTE — Anesthesia Procedure Notes (Signed)
Procedure Name: LMA Insertion Date/Time: 12/25/2020 7:38 AM Performed by: Bonney Aid, CRNA Pre-anesthesia Checklist: Patient identified, Emergency Drugs available, Suction available and Patient being monitored Patient Re-evaluated:Patient Re-evaluated prior to induction Oxygen Delivery Method: Circle system utilized Preoxygenation: Pre-oxygenation with 100% oxygen Induction Type: IV induction Ventilation: Mask ventilation without difficulty LMA: LMA inserted LMA Size: 4.0 Number of attempts: 1 Airway Equipment and Method: Bite block Placement Confirmation: positive ETCO2 Dental Injury: Teeth and Oropharynx as per pre-operative assessment

## 2020-12-25 NOTE — Transfer of Care (Signed)
Immediate Anesthesia Transfer of Care Note  Patient: Shelley Holden  Procedure(s) Performed: DILATATION & CURETTAGE/HYSTEROSCOPY WITH MYOSURE (N/A ) INTRAUTERINE DEVICE (IUD) INSERTION, MIRENA (N/A )  Patient Location: PACU  Anesthesia Type:General  Level of Consciousness: sedated  Airway & Oxygen Therapy: Patient Spontanous Breathing and Patient connected to nasal cannula oxygen  Post-op Assessment: Post -op Vital signs reviewed and stable  Post vital signs: Reviewed and stable  Last Vitals:  Vitals Value Taken Time  BP 137/84   Temp    Pulse 89 12/25/20 0812  Resp 13 12/25/20 0812  SpO2 99 % 12/25/20 0812  Vitals shown include unvalidated device data.  Last Pain:  Vitals:   12/25/20 0600  TempSrc: Oral  PainSc: 0-No pain      Patients Stated Pain Goal: 5 (79/72/82 0601)  Complications: No complications documented.

## 2020-12-25 NOTE — Anesthesia Postprocedure Evaluation (Signed)
Anesthesia Post Note  Patient: Shelley Holden  Procedure(s) Performed: DILATATION & CURETTAGE/HYSTEROSCOPY WITH MYOSURE (N/A ) INTRAUTERINE DEVICE (IUD) INSERTION, MIRENA (N/A )     Patient location during evaluation: PACU Anesthesia Type: General Level of consciousness: awake and alert Pain management: pain level controlled Vital Signs Assessment: post-procedure vital signs reviewed and stable Respiratory status: spontaneous breathing, nonlabored ventilation, respiratory function stable and patient connected to nasal cannula oxygen Cardiovascular status: blood pressure returned to baseline and stable Postop Assessment: no apparent nausea or vomiting Anesthetic complications: no   No complications documented.  Last Vitals:  Vitals:   12/25/20 0915 12/25/20 1009  BP: 122/75 120/72  Pulse: 78 78  Resp: 14 15  Temp:  37 C  SpO2: 99% 100%    Last Pain:  Vitals:   12/25/20 1009  TempSrc:   PainSc: 0-No pain                 Belenda Cruise P Jaskirat Schwieger

## 2020-12-25 NOTE — Op Note (Addendum)
12/25/20   Surgeon: Eula Flax, MD Preoperative Diagnoses: AUB-HMB Postoperative Diagnoses: Same as above; endometrial polyp   Procedures performed:  1) Hysteroscopy dilation and curettage 2) Myosure resection of polypoid tissue 3) Mirena IUD placement   IVF: 400cc LR EBL: <10cc UOP: 150cc clear yellow urine via red rubber  Fluid deficit: 125cc   Anesthesia: LMA  Findings: External genitalia WNL, BME reveals 10-11cm anteverted uterus. Upon insertion of hysteroscope, two polypoid tissue projections at 7 o clock. Uterus sounded to 12cm. Rest of endometrium appears WNL, BL ostia viewed without issue   Indications: Ms Dominik is a 45yo G5P5006 with AUB-HMB. Preoperative imaging via TVUS shows thickened EMS with possible polyps as well as possible adenomyosis. Pt desires surgical management of AUB. Consented for above procedures   RBA:  The patient was informed of the risks and benefits of a hysteroscopy with dilation and curettage. Risks included but were not limited to bleeidng, infections, injury to the vulva, vagina or cervix, or uterine perforation. If concern for latter, may proceed with diagnostic laparoscopy for evaluation of injury which may require surgical repair. Patient understands and is amenable.      Operative details: Patient was taken to operating room where general anesthesia via LMA was established. Placed in dorsal lithotomy with appropriate padded points. No antibiotics given preop via ACOG recommendations. Time out was performed after vaginal prep was carried out with Betadine. Tenaculum then placed on anterior cervical lip. Pratt dilators used (19) to dilate internal os. Gentle traction used to dilate internal os. Upon insertion of Myosure hysteroscope, findings noted as above. Myosure resection carried out until visible pahtology was removed. Scope removed, D&C carried out in gentle fashion clockwise until gritty texture noted throughout. Mirena IUD then placed in  standard fashion. Tenaculum removed. Bleeding controlled with pressure. All instruments removed from vagina.   Fluid deficit controlled throughout, final deficit as above  Patient tolerated procedure well. All counts correct at end of procedure  IUD expiration Jul 2024 Lot # [id number]

## 2020-12-26 ENCOUNTER — Encounter (HOSPITAL_BASED_OUTPATIENT_CLINIC_OR_DEPARTMENT_OTHER): Payer: Self-pay | Admitting: Obstetrics and Gynecology

## 2020-12-26 LAB — SURGICAL PATHOLOGY

## 2021-02-08 ENCOUNTER — Other Ambulatory Visit: Payer: Self-pay

## 2021-02-08 ENCOUNTER — Non-Acute Institutional Stay (HOSPITAL_COMMUNITY)
Admission: RE | Admit: 2021-02-08 | Discharge: 2021-02-08 | Disposition: A | Discharge status: Home / Self Care | Payer: 59 | Source: Ambulatory Visit | Attending: Internal Medicine | Admitting: Internal Medicine

## 2021-02-08 DIAGNOSIS — D500 Iron deficiency anemia secondary to blood loss (chronic): Secondary | ICD-10-CM | POA: Insufficient documentation

## 2021-02-08 DIAGNOSIS — N920 Excessive and frequent menstruation with regular cycle: Secondary | ICD-10-CM | POA: Diagnosis present

## 2021-02-08 MED ORDER — SODIUM CHLORIDE 0.9 % IV SOLN
INTRAVENOUS | Status: DC | PRN
  Administered 2021-02-08: 250 mL via INTRAVENOUS

## 2021-02-08 MED ORDER — SODIUM CHLORIDE 0.9 % IV SOLN
200.0000 mg | INTRAVENOUS | Status: DC
  Administered 2021-02-08: 200 mg via INTRAVENOUS
  Filled 2021-02-08: qty 10

## 2021-02-08 NOTE — Discharge Instructions (Signed)

## 2021-02-08 NOTE — Progress Notes (Signed)
Patient received IV Venofer as ordered by Eula Flax, MD. Observed for at least 30 minutes post infusion.Tolerated well, vitals stable, discharge instructions given, verbalized understanding. Alert, oriented and ambulatory at the time of discharge.

## 2021-02-20 ENCOUNTER — Encounter (HOSPITAL_BASED_OUTPATIENT_CLINIC_OR_DEPARTMENT_OTHER): Payer: Self-pay | Admitting: Obstetrics and Gynecology

## 2021-02-20 ENCOUNTER — Other Ambulatory Visit: Payer: Self-pay

## 2021-02-20 NOTE — Progress Notes (Signed)
YOU ARE SCHEDULED FOR A COVID TEST _ 02-25-2021 AT 1015 AM.  THIS TEST MUST BE DONE BEFORE SURGERY. GO TO  [place]. [place], [place], IT IS APPROXIMATELY 2 MINUTES PAST [employer] ON THE RIGHT AND REMAIN IN YOUR CAR, THIS IS A DRIVE UP TEST.       Your procedure is scheduled on 02-27-2021  Report to [facility].   Call this number if you have problems the morning of surgery  [phone].   OUR ADDRESS IS [place].  WE ARE LOCATED IN [facility].  PLEASE BRING YOUR INSURANCE CARD AND PHOTO ID DAY OF SURGERY.  ONLY ONE PERSON ALLOWED IN FACILITY WAITING AREA.                                     REMEMBER:  DO NOT EAT FOOD, CANDY GUM OR MINTS  AFTER MIDNIGHT . YOU MAY HAVE CLEAR LIQUIDS FROM MIDNIGHT UNTIL 430 AM. NO CLEAR LIQUIDS AFTER 430 AM DAY OF SURGERY.   YOU MAY  BRUSH YOUR TEETH MORNING OF SURGERY AND RINSE YOUR MOUTH OUT, NO CHEWING GUM CANDY OR MINTS.    CLEAR LIQUID DIET   Foods Allowed                                                                     Foods Excluded  Coffee and tea, regular and decaf                             liquids that you cannot  Plain Jell-O any favor except red or purple                                           see through such as: Fruit ices (not with fruit pulp)                                     milk, soups, orange juice  Iced Popsicles                                    All solid food Carbonated beverages, regular and diet                                    Cranberry, grape and apple juices Sports drinks like Gatorade Lightly seasoned clear broth or consume(fat free) Sugar, honey syrup  Sample Menu Breakfast                                Lunch  Supper Cranberry juice                    Beef broth                            Chicken broth Jell-O                                     Grape juice                           Apple  juice Coffee or tea                        Jell-O                                      Popsicle                                                Coffee or tea                        Coffee or tea  _____________________________________________________________________     TAKE THESE MEDICATIONS MORNING OF SURGERY WITH A SIP OF WATER: NONE  ONE VISITOR IS ALLOWED IN WAITING ROOM ONLY DAY OF SURGERY.  NO VISITOR MAY SPEND THE NIGHT.  VISITOR ARE ALLOWED TO STAY UNTIL 800 PM.                                    DO NOT WEAR JEWERLY, MAKE UP. DO NOT WEAR LOTIONS, POWDERS, PERFUMES OR NAIL POLISH. DO NOT SHAVE FOR 24 HOURS PRIOR TO DAY OF SURGERY. MEN MAY SHAVE FACE AND NECK. CONTACTS, GLASSES, OR DENTURES MAY NOT BE WORN TO SURGERY.                                    [facility] IS NOT RESPONSIBLE  FOR ANY BELONGINGS.                                                                    Shelley Holden Kitchen           [name] - Preparing for Surgery Before surgery, you can play an important role.  Because skin is not sterile, your skin needs to be as free of germs as possible.  You can reduce the number of germs on your skin by washing with CHG (chlorahexidine gluconate) soap before surgery.  CHG is an antiseptic cleaner which kills germs and bonds with the skin to continue killing germs even after washing. Please DO NOT use if you have an allergy to CHG or antibacterial soaps.  If your skin becomes reddened/irritated stop  using the CHG and inform your nurse when you arrive at Short Stay. Do not shave (including legs and underarms) for at least 48 hours prior to the first CHG shower.  You may shave your face/neck. Please follow these instructions carefully:  1.  Shower with CHG Soap the night before surgery and the  morning of Surgery.  2.  If you choose to wash your hair, wash your hair first as usual with your  normal  shampoo.  3.  After you shampoo, rinse your hair and body thoroughly to remove the  shampoo.                             4.  Use CHG as you would any other liquid soap.  You can apply chg directly  to the skin and wash                      Gently with a scrungie or clean washcloth.  5.  Apply the CHG Soap to your body ONLY FROM THE NECK DOWN.   Do not use on face/ open                           Wound or open sores. Avoid contact with eyes, ears mouth and genitals (private parts).                       Wash face,  Genitals (private parts) with your normal soap.             6.  Wash thoroughly, paying special attention to the area where your surgery  will be performed.  7.  Thoroughly rinse your body with warm water from the neck down.  8.  DO NOT shower/wash with your normal soap after using and rinsing off  the CHG Soap.                9.  Pat yourself dry with a clean towel.            10.  Wear clean pajamas.            11.  Place clean sheets on your bed the night of your first shower and do not  sleep with pets. Day of Surgery : Do not apply any lotions/deodorants the morning of surgery.  Please wear clean clothes to the hospital/surgery center.  FAILURE TO FOLLOW THESE INSTRUCTIONS MAY RESULT IN THE CANCELLATION OF YOUR SURGERY PATIENT SIGNATURE_________________________________  NURSE SIGNATURE__________________________________  ________________________________________________________________________                                                        QUESTIONS [facility] PRE OP NURSE PHONE [phone].

## 2021-02-20 NOTE — Progress Notes (Addendum)
Spoke w/ via phone for pre-op interview---PT Lab needs dos----   URINE POCT          Lab results------HAS LAB APPT 02-25-2021 915 FOR CBC T & S PER ANESTHESIA SURGERY ORDERS PENDING COVID test -----02-25-2021 AT 1015 Arrive at -------530 AM 02-27-2021 NPO after MN NO Solid Food.  Clear liquids from MN until---430 AM THEN NPO Med rec completed Medications to take morning of surgery -----NONE Diabetic medication -----N/A Patient instructed no nail polish to be worn day of surgery Patient instructed to bring photo id and insurance card day of surgery Patient aware to have Driver (ride ) / caregiver HUSBAND [name] WILL DROP PT OFF    for 24 hours after surgery  Patient Special Instructions -----NONE Pre-Op special Istructions -----REQUESTED [name] Patient verbalized understanding of instructions that were given at this phone interview. Patient denies shortness of breath, chest pain, fever, cough at this phone interview.

## 2021-02-25 ENCOUNTER — Other Ambulatory Visit: Payer: Self-pay

## 2021-02-25 ENCOUNTER — Other Ambulatory Visit (HOSPITAL_COMMUNITY)
Admission: RE | Admit: 2021-02-25 | Discharge: 2021-02-25 | Disposition: A | Discharge status: Home / Self Care | Payer: 59 | Source: Ambulatory Visit | Attending: Obstetrics and Gynecology | Admitting: Obstetrics and Gynecology

## 2021-02-25 ENCOUNTER — Encounter (HOSPITAL_COMMUNITY)
Admission: RE | Admit: 2021-02-25 | Discharge: 2021-02-25 | Disposition: A | Discharge status: Home / Self Care | Payer: 59 | Source: Ambulatory Visit | Attending: Obstetrics and Gynecology | Admitting: Obstetrics and Gynecology

## 2021-02-25 DIAGNOSIS — Z01812 Encounter for preprocedural laboratory examination: Secondary | ICD-10-CM | POA: Diagnosis not present

## 2021-02-25 DIAGNOSIS — Z20822 Contact with and (suspected) exposure to covid-19: Secondary | ICD-10-CM | POA: Diagnosis not present

## 2021-02-25 LAB — CBC
HCT: 24.9 % — ABNORMAL LOW (ref 36.0–46.0)
Hemoglobin: 6.8 g/dL — CL (ref 12.0–15.0)
MCH: 19 pg — ABNORMAL LOW (ref 26.0–34.0)
MCHC: 27.3 g/dL — ABNORMAL LOW (ref 30.0–36.0)
MCV: 69.7 fL — ABNORMAL LOW (ref 80.0–100.0)
Platelets: 271 10*3/uL (ref 150–400)
RBC: 3.57 MIL/uL — ABNORMAL LOW (ref 3.87–5.11)
RDW: 18.5 % — ABNORMAL HIGH (ref 11.5–15.5)
WBC: 4.3 10*3/uL (ref 4.0–10.5)
nRBC: 0 % (ref 0.0–0.2)

## 2021-02-25 LAB — SARS CORONAVIRUS 2 (TAT 6-24 HRS): SARS Coronavirus 2: NEGATIVE

## 2021-02-25 NOTE — Progress Notes (Signed)
CRITICAL RESULT PROVIDER NOTIFICATION  Test performed and critical result:cbc  Date and time result received: 1338 from katherine in lab  Provider name/title:denise sexton rn  Date and time provider notified:02-25-2021 147 pm left message with hannah  surgery scheduled for return call from md or np asap due to no answer at office, left message with Janett Billow triage for dr shivaji for dr Nelva Nay to return call asap  Date and time provider responded: 02-25-2021 @ 1504  Provider response:Evaluate remotely per Dr Wilhelmenia Blase via phone stated will put in pre-op orders to have/ hold two units of blood for day of surgery for intraoperative infusion. Dr Wilhelmenia Blase stated she spoke w/ pt via phone, pt is asymptomatic,  and due to pt's schedule she is unable to arrange to get blood transfusion prior to surgery.   Addendum: spoke with dr ryan ellander mda pt ok for [facility] with hamaglobin of 6.8 and pt to have 2 units on hold for day of surgery,

## 2021-02-26 ENCOUNTER — Other Ambulatory Visit: Payer: Self-pay

## 2021-02-26 NOTE — Progress Notes (Signed)
Pt aware to arrive at [facility] admitting at [place] on 02/27/2021 for scheduled surgical procedure.

## 2021-02-26 NOTE — Anesthesia Preprocedure Evaluation (Addendum)
Anesthesia Evaluation  Patient identified by MRN, date of birth, ID band Patient awake    Reviewed: Allergy & Precautions, NPO status , Patient's Chart, lab work & pertinent test results  History of Anesthesia Complications Negative for: history of anesthetic complications  Airway Mallampati: I  TM Distance: >3 FB Neck ROM: Full    Dental  (+) Dental Advisory Given   Pulmonary former smoker,    Pulmonary exam normal        Cardiovascular negative cardio ROS Normal cardiovascular exam     Neuro/Psych negative neurological ROS  negative psych ROS   GI/Hepatic negative GI ROS, Neg liver ROS,   Endo/Other  negative endocrine ROS  Renal/GU negative Renal ROS  Female GU complaint     Musculoskeletal negative musculoskeletal ROS (+)   Abdominal   Peds  Hematology  (+) anemia ,   Anesthesia Other Findings Covid test negative   Reproductive/Obstetrics  AUB                             Anesthesia Physical Anesthesia Plan  ASA: 2  Anesthesia Plan: General   Post-op Pain Management:    Induction: Intravenous  PONV Risk Score and Plan: 4 or greater and Ondansetron, Aprepitant, Midazolam, Treatment may vary due to age or medical condition, Dexamethasone and Scopolamine patch - Pre-op  Airway Management Planned: Oral ETT  Additional Equipment: None  Intra-op Plan:   Post-operative Plan: Extubation in OR  Informed Consent: I have reviewed the patients History and Physical, chart, labs and discussed the procedure including the risks, benefits and alternatives for the proposed anesthesia with the patient or authorized representative who has indicated his/her understanding and acceptance.     Dental advisory given  Plan Discussed with: CRNA and Anesthesiologist  Anesthesia Plan Comments:        Anesthesia Quick Evaluation

## 2021-02-27 ENCOUNTER — Encounter (HOSPITAL_COMMUNITY): Payer: Self-pay | Admitting: Obstetrics and Gynecology

## 2021-02-27 ENCOUNTER — Encounter (HOSPITAL_COMMUNITY)
Admission: RE | Disposition: A | Discharge status: Home / Self Care | Payer: Self-pay | Source: Home / Self Care | Attending: Obstetrics and Gynecology

## 2021-02-27 ENCOUNTER — Inpatient Hospital Stay (HOSPITAL_COMMUNITY): Payer: 59 | Admitting: Anesthesiology

## 2021-02-27 ENCOUNTER — Inpatient Hospital Stay (HOSPITAL_COMMUNITY): Payer: 59

## 2021-02-27 ENCOUNTER — Observation Stay (HOSPITAL_COMMUNITY)
Admission: RE | Admit: 2021-02-27 | Discharge: 2021-02-28 | Disposition: A | Discharge status: Home / Self Care | Payer: 59 | Attending: Obstetrics and Gynecology | Admitting: Obstetrics and Gynecology

## 2021-02-27 DIAGNOSIS — N736 Female pelvic peritoneal adhesions (postinfective): Secondary | ICD-10-CM | POA: Diagnosis not present

## 2021-02-27 DIAGNOSIS — D259 Leiomyoma of uterus, unspecified: Principal | ICD-10-CM | POA: Insufficient documentation

## 2021-02-27 DIAGNOSIS — N838 Other noninflammatory disorders of ovary, fallopian tube and broad ligament: Secondary | ICD-10-CM | POA: Insufficient documentation

## 2021-02-27 DIAGNOSIS — Z87891 Personal history of nicotine dependence: Secondary | ICD-10-CM | POA: Insufficient documentation

## 2021-02-27 DIAGNOSIS — N879 Dysplasia of cervix uteri, unspecified: Secondary | ICD-10-CM | POA: Diagnosis not present

## 2021-02-27 DIAGNOSIS — N939 Abnormal uterine and vaginal bleeding, unspecified: Secondary | ICD-10-CM | POA: Diagnosis present

## 2021-02-27 DIAGNOSIS — D649 Anemia, unspecified: Secondary | ICD-10-CM | POA: Insufficient documentation

## 2021-02-27 DIAGNOSIS — N800 Endometriosis of uterus: Secondary | ICD-10-CM | POA: Insufficient documentation

## 2021-02-27 DIAGNOSIS — Z419 Encounter for procedure for purposes other than remedying health state, unspecified: Secondary | ICD-10-CM

## 2021-02-27 HISTORY — PX: TOTAL LAPAROSCOPIC HYSTERECTOMY WITH SALPINGECTOMY: SHX6742

## 2021-02-27 HISTORY — PX: CYSTOSCOPY: SHX5120

## 2021-02-27 LAB — CBC WITH DIFFERENTIAL/PLATELET
Abs Immature Granulocytes: 0.01 10*3/uL (ref 0.00–0.07)
Basophils Absolute: 0 10*3/uL (ref 0.0–0.1)
Basophils Relative: 1 %
Eosinophils Absolute: 0.1 10*3/uL (ref 0.0–0.5)
Eosinophils Relative: 1 %
HCT: 25 % — ABNORMAL LOW (ref 36.0–46.0)
Hemoglobin: 6.7 g/dL — CL (ref 12.0–15.0)
Immature Granulocytes: 0 %
Lymphocytes Relative: 30 %
Lymphs Abs: 1.5 10*3/uL (ref 0.7–4.0)
MCH: 18.9 pg — ABNORMAL LOW (ref 26.0–34.0)
MCHC: 26.8 g/dL — ABNORMAL LOW (ref 30.0–36.0)
MCV: 70.4 fL — ABNORMAL LOW (ref 80.0–100.0)
Monocytes Absolute: 0.4 10*3/uL (ref 0.1–1.0)
Monocytes Relative: 9 %
Neutro Abs: 3 10*3/uL (ref 1.7–7.7)
Neutrophils Relative %: 59 %
Platelets: 267 10*3/uL (ref 150–400)
RBC: 3.55 MIL/uL — ABNORMAL LOW (ref 3.87–5.11)
RDW: 18.5 % — ABNORMAL HIGH (ref 11.5–15.5)
WBC: 5.1 10*3/uL (ref 4.0–10.5)
nRBC: 0 % (ref 0.0–0.2)

## 2021-02-27 LAB — POCT I-STAT EG7
Acid-base deficit: 6 mmol/L — ABNORMAL HIGH (ref 0.0–2.0)
Bicarbonate: 23.5 mmol/L (ref 20.0–28.0)
Calcium, Ion: 1.21 mmol/L (ref 1.15–1.40)
HCT: 30 % — ABNORMAL LOW (ref 36.0–46.0)
Hemoglobin: 10.2 g/dL — ABNORMAL LOW (ref 12.0–15.0)
O2 Saturation: 28 %
Potassium: 4.2 mmol/L (ref 3.5–5.1)
Sodium: 140 mmol/L (ref 135–145)
TCO2: 25 mmol/L (ref 22–32)
pCO2, Ven: 65.6 mmHg — ABNORMAL HIGH (ref 44.0–60.0)
pH, Ven: 7.163 — CL (ref 7.250–7.430)
pO2, Ven: 24 mmHg — CL (ref 32.0–45.0)

## 2021-02-27 LAB — BASIC METABOLIC PANEL
Anion gap: 7 (ref 5–15)
BUN: 8 mg/dL (ref 6–20)
CO2: 23 mmol/L (ref 22–32)
Calcium: 8.6 mg/dL — ABNORMAL LOW (ref 8.9–10.3)
Chloride: 108 mmol/L (ref 98–111)
Creatinine, Ser: 0.75 mg/dL (ref 0.44–1.00)
GFR, Estimated: >60 mL/min (ref >60)
Glucose, Bld: 100 mg/dL — ABNORMAL HIGH (ref 70–99)
Potassium: 3.8 mmol/L (ref 3.5–5.1)
Sodium: 138 mmol/L (ref 135–145)

## 2021-02-27 LAB — CBC
HCT: 32.6 % — ABNORMAL LOW (ref 36.0–46.0)
Hemoglobin: 9.2 g/dL — ABNORMAL LOW (ref 12.0–15.0)
MCH: 21.3 pg — ABNORMAL LOW (ref 26.0–34.0)
MCHC: 28.2 g/dL — ABNORMAL LOW (ref 30.0–36.0)
MCV: 75.5 fL — ABNORMAL LOW (ref 80.0–100.0)
Platelets: 272 10*3/uL (ref 150–400)
RBC: 4.32 MIL/uL (ref 3.87–5.11)
RDW: 19.9 % — ABNORMAL HIGH (ref 11.5–15.5)
WBC: 14.5 10*3/uL — ABNORMAL HIGH (ref 4.0–10.5)
nRBC: 0 % (ref 0.0–0.2)

## 2021-02-27 LAB — PREPARE RBC (CROSSMATCH)

## 2021-02-27 LAB — PREGNANCY, URINE: Preg Test, Ur: NEGATIVE

## 2021-02-27 SURGERY — HYSTERECTOMY, TOTAL, LAPAROSCOPIC, WITH SALPINGECTOMY
Anesthesia: General | Site: Bladder

## 2021-02-27 MED ORDER — LACTATED RINGERS IV SOLN: INTRAVENOUS | Status: DC

## 2021-02-27 MED ORDER — HYDROMORPHONE HCL 1 MG/ML IJ SOLN
0.2500 mg | INTRAMUSCULAR | Status: DC | PRN
  Administered 2021-02-27: 0.5 mg via INTRAVENOUS

## 2021-02-27 MED ORDER — DEXMEDETOMIDINE (PRECEDEX) IN NS 20 MCG/5ML (4 MCG/ML) IV SYRINGE
PREFILLED_SYRINGE | INTRAVENOUS | Status: DC | PRN
  Administered 2021-02-27: 8 ug via INTRAVENOUS
  Administered 2021-02-27 (×2): 4 ug via INTRAVENOUS

## 2021-02-27 MED ORDER — CEFAZOLIN SODIUM-DEXTROSE 2-4 GM/100ML-% IV SOLN
INTRAVENOUS | Status: AC
  Filled 2021-02-27: qty 100

## 2021-02-27 MED ORDER — SODIUM CHLORIDE 0.9 % IR SOLN
Status: DC | PRN
  Administered 2021-02-27: 700 mL via INTRAVESICAL

## 2021-02-27 MED ORDER — DEXMEDETOMIDINE (PRECEDEX) IN NS 20 MCG/5ML (4 MCG/ML) IV SYRINGE
PREFILLED_SYRINGE | INTRAVENOUS | Status: AC
  Filled 2021-02-27: qty 5

## 2021-02-27 MED ORDER — ONDANSETRON HCL 4 MG/2ML IJ SOLN: 4.0000 mg | Freq: Four times a day (QID) | INTRAMUSCULAR | Status: DC | PRN

## 2021-02-27 MED ORDER — MIDAZOLAM HCL 2 MG/2ML IJ SOLN
INTRAMUSCULAR | Status: AC
  Filled 2021-02-27: qty 2

## 2021-02-27 MED ORDER — SODIUM CHLORIDE 0.9% IV SOLUTION: Freq: Once | INTRAVENOUS | Status: DC

## 2021-02-27 MED ORDER — POLYETHYLENE GLYCOL 3350 17 G PO PACK: 17.0000 g | PACK | Freq: Every day | ORAL | Status: DC | PRN

## 2021-02-27 MED ORDER — SODIUM CHLORIDE 0.9 % IR SOLN
Status: DC | PRN
  Administered 2021-02-27: 1000 mL
  Administered 2021-02-27: 300 mL

## 2021-02-27 MED ORDER — DIPHENHYDRAMINE HCL 50 MG/ML IJ SOLN
INTRAMUSCULAR | Status: AC
  Filled 2021-02-27: qty 1

## 2021-02-27 MED ORDER — DIPHENHYDRAMINE HCL 50 MG/ML IJ SOLN
INTRAMUSCULAR | Status: DC | PRN
  Administered 2021-02-27: 6.25 mg via INTRAVENOUS

## 2021-02-27 MED ORDER — PROPOFOL 10 MG/ML IV BOLUS
INTRAVENOUS | Status: AC
  Filled 2021-02-27: qty 20

## 2021-02-27 MED ORDER — BUPIVACAINE HCL (PF) 0.25 % IJ SOLN
INTRAMUSCULAR | Status: AC
  Filled 2021-02-27: qty 30

## 2021-02-27 MED ORDER — SIMETHICONE 80 MG PO CHEW
80.0000 mg | CHEWABLE_TABLET | Freq: Four times a day (QID) | ORAL | Status: DC | PRN
  Administered 2021-02-27 – 2021-02-28 (×2): 80 mg via ORAL
  Filled 2021-02-27 (×2): qty 1

## 2021-02-27 MED ORDER — LIDOCAINE 2% (20 MG/ML) 5 ML SYRINGE
INTRAMUSCULAR | Status: DC | PRN
  Administered 2021-02-27: 60 mg via INTRAVENOUS

## 2021-02-27 MED ORDER — OXYCODONE HCL 5 MG/5ML PO SOLN: 5.0000 mg | Freq: Once | ORAL | Status: DC | PRN

## 2021-02-27 MED ORDER — ACETAMINOPHEN 10 MG/ML IV SOLN
INTRAVENOUS | Status: DC | PRN
  Administered 2021-02-27: 1000 mg via INTRAVENOUS

## 2021-02-27 MED ORDER — CHLORHEXIDINE GLUCONATE CLOTH 2 % EX PADS: 6.0000 | MEDICATED_PAD | Freq: Every day | CUTANEOUS | Status: DC

## 2021-02-27 MED ORDER — HYDROMORPHONE HCL 2 MG/ML IJ SOLN
INTRAMUSCULAR | Status: AC
  Filled 2021-02-27: qty 1

## 2021-02-27 MED ORDER — KETOROLAC TROMETHAMINE 30 MG/ML IJ SOLN
30.0000 mg | Freq: Four times a day (QID) | INTRAMUSCULAR | Status: AC
  Administered 2021-02-27 – 2021-02-28 (×4): 30 mg via INTRAVENOUS
  Filled 2021-02-27 (×4): qty 1

## 2021-02-27 MED ORDER — OXYCODONE HCL 5 MG PO TABS
5.0000 mg | ORAL_TABLET | ORAL | Status: DC | PRN
  Administered 2021-02-27 – 2021-02-28 (×5): 10 mg via ORAL
  Filled 2021-02-27 (×5): qty 2

## 2021-02-27 MED ORDER — ROCURONIUM BROMIDE 100 MG/10ML IV SOLN
INTRAVENOUS | Status: DC | PRN
  Administered 2021-02-27 (×2): 10 mg via INTRAVENOUS
  Administered 2021-02-27: 20 mg via INTRAVENOUS
  Administered 2021-02-27: 70 mg via INTRAVENOUS

## 2021-02-27 MED ORDER — FLUORESCEIN SODIUM 10 % IV SOLN
INTRAVENOUS | Status: DC | PRN
  Administered 2021-02-27: 100 mg via INTRAVENOUS

## 2021-02-27 MED ORDER — FENTANYL CITRATE (PF) 250 MCG/5ML IJ SOLN
INTRAMUSCULAR | Status: AC
  Filled 2021-02-27: qty 5

## 2021-02-27 MED ORDER — OXYCODONE HCL 5 MG PO TABS
5.0000 mg | ORAL_TABLET | Freq: Once | ORAL | Status: DC | PRN
Start: 2021-02-27 — End: 2021-02-27

## 2021-02-27 MED ORDER — FENTANYL CITRATE (PF) 100 MCG/2ML IJ SOLN
INTRAMUSCULAR | Status: DC | PRN
  Administered 2021-02-27 (×5): 50 ug via INTRAVENOUS

## 2021-02-27 MED ORDER — POVIDONE-IODINE 10 % EX SWAB: 2.0000 "application " | Freq: Once | CUTANEOUS | Status: DC

## 2021-02-27 MED ORDER — ROCURONIUM BROMIDE 10 MG/ML (PF) SYRINGE
PREFILLED_SYRINGE | INTRAVENOUS | Status: AC
  Filled 2021-02-27: qty 10

## 2021-02-27 MED ORDER — HYDROMORPHONE HCL 1 MG/ML IJ SOLN
INTRAMUSCULAR | Status: DC | PRN
  Administered 2021-02-27: 1 mg via INTRAVENOUS

## 2021-02-27 MED ORDER — ONDANSETRON HCL 4 MG/2ML IJ SOLN
INTRAMUSCULAR | Status: DC | PRN
  Administered 2021-02-27 (×2): 4 mg via INTRAVENOUS

## 2021-02-27 MED ORDER — FENTANYL CITRATE (PF) 100 MCG/2ML IJ SOLN
25.0000 ug | INTRAMUSCULAR | Status: DC | PRN
  Administered 2021-02-27 (×2): 50 ug via INTRAVENOUS

## 2021-02-27 MED ORDER — SODIUM CHLORIDE 0.9 % IV SOLN: INTRAVENOUS | Status: DC | PRN

## 2021-02-27 MED ORDER — ACETAMINOPHEN 500 MG PO TABS
1000.0000 mg | ORAL_TABLET | Freq: Four times a day (QID) | ORAL | Status: DC
  Administered 2021-02-27 – 2021-02-28 (×5): 1000 mg via ORAL
  Filled 2021-02-27 (×5): qty 2

## 2021-02-27 MED ORDER — BUPIVACAINE HCL (PF) 0.25 % IJ SOLN
INTRAMUSCULAR | Status: DC | PRN
  Administered 2021-02-27: 10 mL

## 2021-02-27 MED ORDER — 0.9 % SODIUM CHLORIDE (POUR BTL) OPTIME
TOPICAL | Status: DC | PRN
  Administered 2021-02-27: 1000 mL

## 2021-02-27 MED ORDER — DOCUSATE SODIUM 100 MG PO CAPS
100.0000 mg | ORAL_CAPSULE | Freq: Two times a day (BID) | ORAL | Status: DC
  Administered 2021-02-27 – 2021-02-28 (×2): 100 mg via ORAL
  Filled 2021-02-27 (×2): qty 1

## 2021-02-27 MED ORDER — LIDOCAINE 2% (20 MG/ML) 5 ML SYRINGE
INTRAMUSCULAR | Status: AC
  Filled 2021-02-27: qty 5

## 2021-02-27 MED ORDER — FENTANYL CITRATE (PF) 100 MCG/2ML IJ SOLN
INTRAMUSCULAR | Status: AC
  Administered 2021-02-27: 50 ug via INTRAVENOUS
  Filled 2021-02-27: qty 2

## 2021-02-27 MED ORDER — HYDROMORPHONE HCL 1 MG/ML IJ SOLN
INTRAMUSCULAR | Status: AC
  Administered 2021-02-27: 0.5 mg via INTRAVENOUS
  Filled 2021-02-27: qty 1

## 2021-02-27 MED ORDER — ONDANSETRON HCL 4 MG PO TABS: 4.0000 mg | ORAL_TABLET | Freq: Four times a day (QID) | ORAL | Status: DC | PRN

## 2021-02-27 MED ORDER — SUGAMMADEX SODIUM 200 MG/2ML IV SOLN
INTRAVENOUS | Status: DC | PRN
  Administered 2021-02-27: 200 mg via INTRAVENOUS

## 2021-02-27 MED ORDER — CEFAZOLIN SODIUM-DEXTROSE 2-4 GM/100ML-% IV SOLN
2.0000 g | INTRAVENOUS | Status: AC
  Administered 2021-02-27 (×2): 2 g via INTRAVENOUS

## 2021-02-27 MED ORDER — ACETAMINOPHEN 10 MG/ML IV SOLN
INTRAVENOUS | Status: AC
  Filled 2021-02-27: qty 100

## 2021-02-27 MED ORDER — MIDAZOLAM HCL 5 MG/5ML IJ SOLN
INTRAMUSCULAR | Status: DC | PRN
  Administered 2021-02-27: 2 mg via INTRAVENOUS

## 2021-02-27 MED ORDER — PROMETHAZINE HCL 25 MG/ML IJ SOLN: 6.2500 mg | INTRAMUSCULAR | Status: DC | PRN

## 2021-02-27 MED ORDER — IBUPROFEN 400 MG PO TABS
600.0000 mg | ORAL_TABLET | Freq: Four times a day (QID) | ORAL | Status: DC
  Filled 2021-02-27: qty 1

## 2021-02-27 MED ORDER — SODIUM CHLORIDE 0.9 % IV SOLN: 10.0000 mL/h | Freq: Once | INTRAVENOUS | Status: DC

## 2021-02-27 MED ORDER — FENTANYL CITRATE (PF) 100 MCG/2ML IJ SOLN
INTRAMUSCULAR | Status: AC
  Filled 2021-02-27: qty 2

## 2021-02-27 MED ORDER — DEXAMETHASONE SODIUM PHOSPHATE 10 MG/ML IJ SOLN
INTRAMUSCULAR | Status: DC | PRN
  Administered 2021-02-27: 10 mg via INTRAVENOUS

## 2021-02-27 MED ORDER — DEXAMETHASONE SODIUM PHOSPHATE 10 MG/ML IJ SOLN
INTRAMUSCULAR | Status: AC
  Filled 2021-02-27: qty 1

## 2021-02-27 MED ORDER — ONDANSETRON HCL 4 MG/2ML IJ SOLN
INTRAMUSCULAR | Status: AC
  Filled 2021-02-27: qty 2

## 2021-02-27 MED ORDER — PROPOFOL 10 MG/ML IV BOLUS
INTRAVENOUS | Status: DC | PRN
  Administered 2021-02-27: 200 mg via INTRAVENOUS

## 2021-02-27 SURGICAL SUPPLY — 44 items
APPLICATOR COTTON TIP 6 STRL (MISCELLANEOUS) ×2 IMPLANT
APPLICATOR COTTON TIP 6IN STRL (MISCELLANEOUS) ×3
BLADE SURG SZ10 CARB STEEL (BLADE) ×3 IMPLANT
COVER MAYO STAND STRL (DRAPES) ×3 IMPLANT
DERMABOND ADVANCED (GAUZE/BANDAGES/DRESSINGS) ×1
DERMABOND ADVANCED .7 DNX12 (GAUZE/BANDAGES/DRESSINGS) ×2 IMPLANT
DEVICE SUTURE ENDOST 10MM (ENDOMECHANICALS) ×6 IMPLANT
DURAPREP 26ML APPLICATOR (WOUND CARE) ×3 IMPLANT
ENDOSTITCH 0 SINGLE 48 (SUTURE) IMPLANT
GAUZE 4X4 16PLY RFD (DISPOSABLE) ×6 IMPLANT
GLOVE SURG ENC MOIS LTX SZ6.5 (GLOVE) ×3 IMPLANT
GLOVE SURG LTX SZ6.5 (GLOVE) ×6 IMPLANT
GLOVE SURG UNDER POLY LF SZ6.5 (GLOVE) ×9 IMPLANT
GLOVE SURG UNDER POLY LF SZ7 (GLOVE) ×12 IMPLANT
GOWN STRL REUS W/TWL LRG LVL3 (GOWN DISPOSABLE) ×15 IMPLANT
HIBICLENS CHG 4% 4OZ (MISCELLANEOUS) ×3 IMPLANT
IV NS 1000ML (IV SOLUTION) ×3
IV NS 1000ML BAXH (IV SOLUTION) ×6 IMPLANT
KIT TURNOVER CYSTO (KITS) ×3 IMPLANT
MANIPULATOR VCARE LG CRV RETR (MISCELLANEOUS) IMPLANT
MANIPULATOR VCARE SML CRV RETR (MISCELLANEOUS) IMPLANT
MANIPULATOR VCARE STD CRV RETR (MISCELLANEOUS) ×3 IMPLANT
NS IRRIG 1000ML POUR BTL (IV SOLUTION) ×3 IMPLANT
PACK LAPAROSCOPY BASIN (CUSTOM PROCEDURE TRAY) ×3 IMPLANT
PACK TRENDGUARD 450 HYBRID PRO (MISCELLANEOUS) ×2 IMPLANT
PROTECTOR NERVE ULNAR (MISCELLANEOUS) ×6 IMPLANT
SCISSORS LAP 5X35 DISP (ENDOMECHANICALS) IMPLANT
SET IRRIG Y TYPE TUR BLADDER L (SET/KITS/TRAYS/PACK) ×3 IMPLANT
SET SUCTION IRRIG HYDROSURG (IRRIGATION / IRRIGATOR) ×3 IMPLANT
SET TRI-LUMEN FLTR TB AIRSEAL (TUBING) ×3 IMPLANT
SHEARS HARMONIC ACE PLUS 36CM (ENDOMECHANICALS) ×3 IMPLANT
SUT ENDO VLOC 180-0-8IN (SUTURE) ×9 IMPLANT
SUT VIC AB 3-0 PS2 18 (SUTURE) ×1
SUT VIC AB 3-0 PS2 18XBRD (SUTURE) ×2 IMPLANT
SUT VIC AB 4-0 KS 27 (SUTURE) ×3 IMPLANT
SUT VICRYL 0 UR6 27IN ABS (SUTURE) ×3 IMPLANT
SYSTEM CARTER THOMASON II (TROCAR) ×3 IMPLANT
TOWEL OR 17X26 10 PK STRL BLUE (TOWEL DISPOSABLE) ×6 IMPLANT
TRAY FOLEY W/BAG SLVR 14FR LF (SET/KITS/TRAYS/PACK) ×3 IMPLANT
TRENDGUARD 450 HYBRID PRO PACK (MISCELLANEOUS) ×3
TROCAR PORT AIRSEAL 5X120 (TROCAR) ×3 IMPLANT
TROCAR XCEL NON-BLD 11X100MML (ENDOMECHANICALS) ×3 IMPLANT
TROCAR XCEL NON-BLD 5MMX100MML (ENDOMECHANICALS) ×3 IMPLANT
WARMER LAPAROSCOPE (MISCELLANEOUS) ×3 IMPLANT

## 2021-02-27 NOTE — Progress Notes (Signed)
Patient in room, laying down with sister and husband present. Reviewed intraoperative details of surgery including reasons for added time (extensive adhesive burden, delivery of uterus). Also reviewed broken V-lock suture within posterior cuff. Explained steps taken afterwards and confirmatory scan. Pain well-controlled at this time.   Gen: tired but NAD CV: RRR, CTAB Abd: lap sites x3 CDI. Appropriately TTP for POD#0, mildly tympanic epigastrically GU deferred  Plan for DC Foley in AM. Trend CBC in AM given known chronic anemia and intraop 2u pRBC transfusion. Immediate postop Hgb 9.5, 6.8 preoperatively  Otherwise routine postop care  BP 140/81 (BP Location: Left Leg)   Pulse 80   Temp 97.8 F (36.6 C) (Oral)   Resp 18   Ht 5\' 4"  (1.626 m)   Wt 93 kg   LMP 02/21/2021 Comment: urine preg.negative.02/27/21  SpO2 100%   BMI 35.19 kg/m

## 2021-02-27 NOTE — Transfer of Care (Signed)
Immediate Anesthesia Transfer of Care Note  Patient: Shelley Holden  Procedure(s) Performed: TOTAL LAPAROSCOPIC HYSTERECTOMY WITH SALPINGECTOMY; VAGINAL MORCELLATION (Bilateral: Abdomen) CYSTOSCOPY (Bladder)  Patient Location: PACU  Anesthesia Type:General  Level of Consciousness: drowsy  Airway & Oxygen Therapy: Patient Spontanous Breathing  Post-op Assessment: Report given to RN and Post -op Vital signs reviewed and stable  Post vital signs: Reviewed and stable  Last Vitals:  Vitals Value Taken Time  BP 127/80 02/27/21 1233  Temp    Pulse 87 02/27/21 1236  Resp 10 02/27/21 1236  SpO2 100 % 02/27/21 1236  Vitals shown include unvalidated device data.  Last Pain:  Vitals:   02/27/21 0629  TempSrc:   PainSc: 0-No pain         Complications: No notable events documented.

## 2021-02-27 NOTE — Anesthesia Postprocedure Evaluation (Signed)
Anesthesia Post Note  Patient: [name]  Procedure(s) Performed: TOTAL LAPAROSCOPIC HYSTERECTOMY WITH SALPINGECTOMY; VAGINAL MORCELLATION (Bilateral: Abdomen) CYSTOSCOPY (Bladder)     Patient location during evaluation: PACU Anesthesia Type: General Level of consciousness: awake and alert Pain management: pain level controlled Vital Signs Assessment: post-procedure vital signs reviewed and stable Respiratory status: spontaneous breathing, nonlabored ventilation and respiratory function stable Cardiovascular status: blood pressure returned to baseline and stable Postop Assessment: no apparent nausea or vomiting Anesthetic complications: no   No notable events documented.  Last Vitals:  Vitals:   02/27/21 1415 02/27/21 1430  BP: 131/80 132/72  Pulse: 82 80  Resp: (!) 9 (!) 9  Temp:    SpO2: 97% 99%                   Shelley Holden

## 2021-02-27 NOTE — Anesthesia Procedure Notes (Signed)
Procedure Name: Intubation Date/Time: 02/27/2021 7:47 AM Performed by: Gwyndolyn Saxon, CRNA Pre-anesthesia Checklist: Patient identified, Emergency Drugs available, Suction available and Patient being monitored Patient Re-evaluated:Patient Re-evaluated prior to induction Oxygen Delivery Method: Circle system utilized Preoxygenation: Pre-oxygenation with 100% oxygen Induction Type: IV induction Ventilation: Mask ventilation without difficulty Laryngoscope Size: Miller and 2 Grade View: Grade I Tube type: Oral Tube size: 6.5 mm Number of attempts: 1 Airway Equipment and Method: Patient positioned with wedge pillow and Stylet Placement Confirmation: ETT inserted through vocal cords under direct vision, positive ETCO2 and breath sounds checked- equal and bilateral Secured at: 20 cm Tube secured with: Tape Dental Injury: Teeth and Oropharynx as per pre-operative assessment

## 2021-02-27 NOTE — H&P (Signed)
Shelley Holden is an 45 y.o. female presenting for scheduled surgery. Started menses on Thursday, initially light for two days then heavy flow. Received IV Venofer x1 on 6/3 however preoperative Hgb 6.8. Is aware of 2u pRBC intraop planned, no questions thereabouts  Pertinent Gynecological History: Menses: flow is excessive with use of multiple pads or tampons on heaviest days Bleeding: dysfunctional uterine bleeding Contraception: tubal ligation DES exposure: denies Blood transfusions: none Sexually transmitted diseases: no past history Previous GYN Procedures: none Last mammogram: OVERDUE Last pap: normal Date: 4/422 OB History: G5, P5006 H/o csx x4   Menstrual History: Menarche age: approx 45yo Patient's last menstrual period was 02/21/2021.    Past Medical History:  Diagnosis Date   Anemia    Constipation    Irregular bleeding     Past Surgical History:  Procedure Laterality Date   CESAREAN SECTION     x 4   DILATATION & CURETTAGE/HYSTEROSCOPY WITH MYOSURE N/A 12/25/2020   Procedure: DILATATION & CURETTAGE/HYSTEROSCOPY WITH MYOSURE;  Surgeon: Deliah Boston, MD;  Location: [facility];  Service: Gynecology;  Laterality: N/A;   INTRAUTERINE DEVICE (IUD) INSERTION N/A 12/25/2020   Procedure: INTRAUTERINE DEVICE (IUD) INSERTION, MIRENA;  Surgeon: Deliah Boston, MD;  Location: [facility];  Service: Gynecology;  Laterality: N/A;    Family History  Problem Relation Age of Onset   Cancer Father     Social History:  reports that she has quit smoking. Her smoking use included cigarettes. She has a 0.50 pack-year smoking history. She has never used smokeless tobacco. She reports that she does not drink alcohol and does not use drugs.  Allergies: No Known Allergies  Medications Prior to Admission  Medication Sig Dispense Refill Last Dose   ferrous sulfate 325 (65 FE) MG tablet Take 1 tablet (325 mg total) by mouth daily. 30 tablet 0 Past Week    ibuprofen (ADVIL) 800 MG tablet Take 1 tablet (800 mg total) by mouth every 8 (eight) hours as needed. 30 tablet 0 Past Week    Review of Systems  Constitutional:  Negative for chills and fever.  Respiratory:  Negative for shortness of breath.   Cardiovascular:  Negative for chest pain, palpitations and leg swelling.  Gastrointestinal:  Negative for abdominal pain, nausea and vomiting.  Genitourinary:  Positive for pelvic pain and vaginal bleeding.  Neurological:  Negative for dizziness, weakness and headaches.  Psychiatric/Behavioral:  Negative for suicidal ideas.    Blood pressure (!) 148/90, pulse 81, temperature 98.1 F (36.7 C), temperature source Oral, resp. rate 16, height 5\' 4"  (1.626 m), weight 93 kg, last menstrual period 02/21/2021, SpO2 99 %. Physical Exam Gen: tired but pleasant CV; CTAB, RRR Abd: soft, NTTP. Pfannenstiel incision GU deferred MSK: Neg calf TTP/Homan's BL Psych/neuro: WNL  Results for orders placed or performed during the hospital encounter of 02/27/21 (from the past 24 hour(s))  Pregnancy, urine     Status: None   Collection Time: 02/27/21  5:14 AM  Result Value Ref Range   Preg Test, Ur NEGATIVE NEGATIVE  Prepare RBC (crossmatch)     Status: None   Collection Time: 02/27/21  5:30 AM  Result Value Ref Range   Order Confirmation      ORDER PROCESSED BY BLOOD BANK Performed at [facility] [address] [place], [place] [address]   Basic metabolic panel     Status: Abnormal   Collection Time: 02/27/21  6:20 AM  Result Value Ref Range  Sodium 138 135 - 145 mmol/L   Potassium 3.8 3.5 - 5.1 mmol/L   Chloride 108 98 - 111 mmol/L   CO2 23 22 - 32 mmol/L   Glucose, Bld 100 (H) 70 - 99 mg/dL   BUN 8 6 - 20 mg/dL   Creatinine, Ser 0.75 0.44 - 1.00 mg/dL   Calcium 8.6 (L) 8.9 - 10.3 mg/dL   GFR, Estimated >60 >60 mL/min   Anion gap 7 5 - 15    No results found.  Assessment/Plan: 72BE [id number] presents for TLH/BS/cysto  with possible mini-lap for AUB. H/o csx x2, anemia 2/2 AUB, BMI 36. Starting Hgb 6.8 despite IV venofer outpatient, consented for 2u pRBC intraop, anesthesia on board. Risks of TLH include infection of the uterus, pelvic organs, or skin, inadvertent injury to internal organs, such as bowel or bladder. If there is major injury, extensive surgery may be required. If injury is minor, it may be treated with relative ease. Discussed possibility of excessive blood loss and transfusion. Patient aware that no future fertility will remain after procedure. Patient accepts the possibility of blood transfusion, if necessary. Bowel and/or bladder injury may require prolonged inpatient stay and possible colostomy, Foley catheter, etc, as deemed fit by other surgeon. Aware of possible conversion to open. Patient understands and agrees to move forward with surgery.   [place] 02/27/2021, 7:34 AM

## 2021-02-27 NOTE — Op Note (Signed)
02/27/21  Surgeon: Eula Flax, MD Assistant: Cheri Fowler, MD Preoperative Diagnosis: AUB-HMB with secondary chronic anemia Postoperative Diagnosis: Same as above Procedure: Total laparoscopic hysterectomy with bilateral salpingectomy; cystoscopy; vaginal morcellation; adhesiolysis Anesthesia: General by Dr Fransisco Beau IVF: 900cc LR/NS plus 2u pRBC EBL: 400cc UOP: 75cc clear urine Complications: Broken v-lock needle during cuff closure Specimen: cervix, uterus and bilateral fallopian tubes to pathology Findings: On bimanual exam, nulliparous cervix with globular 10cm anteverted uterus somewhat adherent to abdominal wall. BL adnexa WNL. On insertion of operative speculum, cervix appears WNL. Upon insertion of laparoscope, 1cm omental adhesion noted midline to anterior abdominal wall. Thick 1cm utero-abdominal adhesion from fundus. Likely scar tissue from prior c-sections x4 noted anteriorly causing dextrorotation of uterus. BL ovaries and fallopian tubes WNL. Significantly dilated loops of bowel noted as well. This, along with extensive adhesiolysis added 1.5 hours to initially planned surgical time.    Indications. Ms Ernst Spell is a 45yo [phone] with AUB-HMB who has failed medical management (PO meds, hysteroscopy D&C with Mirena IUD) and desires surgical management in the form of hysterectomy.  TVUS showed 10x9x7 uterus with normal ovaries. Pathology from [facility] showed benign endometrium. Preoperative CBC showed Hgb 7.4. Pt was given IV Venofer x1 however during surgical screening was still found to have Hgb of 6.8. Patient was consented for intraop transfusion of 2u pRBC; anesthesia notified appropriately.   Operative Procedure: The patient was taken to the operating room where a time out was performed to confirm patient and correct procedure. The patient was given 2g Ancef in accordance with ACOG guidelines based on weight. General anesthesia was established. The patient was then positioned on the  operating table in the dorsal lithotomy position with the legs supported using stirrups. All pressure points were padded and a Bair hugger was placed to maintain control of core body temperature.    The patient was then prepped and draped in the usual sterile fashion. A Foley catheter was inserted in normal sterile fashion. An operative speculum was placed into the vagina. A tenaculum was placed on anterior cervical lip. The uterus sounded to 10cm and a V-care uterine manipulator was placed in normal fashion with testing of bubble prior to placement and ensuring cup was placed circumferentially around cervix.    Attention was then turned the abdomen with 0.25% marcaine plain was injected subdermally infraumbilically. A 80mm vertical incision was made infraumbilically. A 60mm trochar and sleeve were inserted through the incision using direct visualization with laparoscope. Once intraabdominal entry was confirmed. Pneumoperitoneum was established using Airseal. Subsequently, two other small incision were made in left and right lower quadrants, both 2cm above and 2cm medial to corresponding ASIS's. LLQ site 44mm, RLQ site 31mm. Airseal port introduced in RLQ, 20mm trochar introduced in LLQ.    Omental adhesion taken down with harmonic scalpel with excellent hemostasis. Attention was then turned to the left uterine fundus after laparoscopic survey showed findings as above. The left broad ligament was then grasped, coagulated and cut using Harmonic scalpel. Process was continued through mesosalpinx and utero-ovarian ligament. Significant adhesions noted as above requirely careful disseciton in order to create bladder flap; Harmonic scalpel was kept close to uterus throughout process and clear urine was noted throughout surgery. After using similar pattern to dissect through broad ligament, the Harmonic was used to coagulate and cut the left uterine artery. The process was then repeated on right aspect which was now  more visible as prior lysis of adhesions allowed for better manipulation of specimen.Marland Kitchen Upon completion,  uterus was thoroughly blanched and good hemostasis was noted. Futher lysis of adhesions was required to complete bladder flap. Bilateral cardinal ligaments were then ligated using Harmonic scalpel allowing for adequate elevation using manipulator to expose area for colpotomy.    The colpotomy incision was made using harmonic and carried out circumferentially, taking care to amputate cervix from vagina without shortening the vaginal cuff. Increased vascularity secondary to aforementioned adhesions increased time taken during colpotomy.The uterus was then retracted into the vagina using the V-care with aid of ring forceps from below. During retraction, transverse diameter of uterus was unable to be delivered into vagina without significant traction. Because of this vaginal morcellation was carried out using #10 blade until uterus was able to gently be delivered. Wet towel was then placed into vagina to maintain pneumoperitoneum.   After suction-irrigation revealed excellent vaginal cuff hemostasis, 0-vicryl Endostitch was used to closed the vaginal cuff in running non-locked fashion. Att midline of cuff, posterior aspect quite thick as mentioned earlier. While taking bite using Endostitch, it was noted that half of needle seemed to have broken off. As it was not immediately visible in posterior culdesac nor within vaginal vault, assumed that needle half was embedded within cuff. Staff notified that XRAY would be required at end of case. The remaining length of the cuff was closed with a new V-lock suture without issue. Good hemostasis noted upon completion. No gross evidence of injury to bladder or bowel noted. Carter-Thompson fascial closure device used to close LLQ 5mm port site after trochar was removed using 0-vicryl suture.    Remaining trochars were removed without issue and pneumoperitoneum was  evacuated. Skin was closed using 4-0 Monocryl and Dermabond overlying.   At this time attention turned towards cystoscopy at which time IV fluoroscein was administered by anesthesia.  Foley catheter removed, routine cystoscopy was carried out. Bilateral efflux noted from ureters.  No suture evident within the bladder.  Bubble sign positive. Foley catheter replaced.  The patient was transferred to recovery room in stable condition. All sponge ad instrument counts were noted to be correct. x2 at end of procedure. Needle count was noted to be missing half a needle as mentioned earlier. Pelvic Xray taken and confirmed by Dr Thornton Papas of Radiology to show a 4.65mm linear lucency with a sharp point consistent with needle fragment. Patient was woken and taken to PACU in stable manner after receiving a second dose of 2g Ancef owing to length of total procedure.

## 2021-02-27 NOTE — Brief Op Note (Signed)
02/27/2021  12:09 PM  PATIENT:  Shelley Holden  45 y.o. female  PRE-OPERATIVE DIAGNOSIS:  chronic anemia, abnormal uterine bleeding  POST-OPERATIVE DIAGNOSIS:  chronic anemia, abnormal uterine bleeding  PROCEDURE:  Procedure(s): TOTAL LAPAROSCOPIC HYSTERECTOMY WITH SALPINGECTOMY (Bilateral) CYSTOSCOPY (N/A) Vaginal Morcellation  SURGEON:  Surgeon(s) and Role:    * Azoria Abbett, Melida Quitter, MD - Primary    * Meisinger, Todd, MD - Assisting  ANESTHESIA:   general  EBL:  400 mL   BLOOD ADMINISTERED: 2 CC PRBC  DRAINS: none   LOCAL MEDICATIONS USED:  MARCAINE 10cc    SPECIMEN:  Source of Specimen:  Bilateral Fallopian tubes, uterus, cervix  DISPOSITION OF SPECIMEN:  PATHOLOGY  COUNTS:  NO Half of V-lock Auto-Suture needle broken in cuff, Pelvic Xray confirms needle placement  PLAN OF CARE: Admit for overnight observation  PATIENT DISPOSITION:  PACU - hemodynamically stable.   Delay start of Pharmacological VTE agent (>24hrs) due to surgical blood loss or risk of bleeding: yes

## 2021-02-28 ENCOUNTER — Encounter (HOSPITAL_COMMUNITY): Payer: Self-pay | Admitting: Obstetrics and Gynecology

## 2021-02-28 DIAGNOSIS — D259 Leiomyoma of uterus, unspecified: Secondary | ICD-10-CM | POA: Diagnosis not present

## 2021-02-28 LAB — CBC
HCT: 25.7 % — ABNORMAL LOW (ref 36.0–46.0)
Hemoglobin: 7.7 g/dL — ABNORMAL LOW (ref 12.0–15.0)
MCH: 21.8 pg — ABNORMAL LOW (ref 26.0–34.0)
MCHC: 30 g/dL (ref 30.0–36.0)
MCV: 72.8 fL — ABNORMAL LOW (ref 80.0–100.0)
Platelets: 249 10*3/uL (ref 150–400)
RBC: 3.53 MIL/uL — ABNORMAL LOW (ref 3.87–5.11)
RDW: 19.7 % — ABNORMAL HIGH (ref 11.5–15.5)
WBC: 11.2 10*3/uL — ABNORMAL HIGH (ref 4.0–10.5)
nRBC: 0 % (ref 0.0–0.2)

## 2021-02-28 MED ORDER — SIMETHICONE 80 MG PO CHEW: 80.0000 mg | CHEWABLE_TABLET | Freq: Four times a day (QID) | ORAL | 0 refills | Status: DC | PRN

## 2021-02-28 MED ORDER — IBUPROFEN 800 MG PO TABS: 800.0000 mg | ORAL_TABLET | Freq: Three times a day (TID) | ORAL | 1 refills | Status: DC

## 2021-02-28 MED ORDER — DOCUSATE SODIUM 100 MG PO CAPS: 100.0000 mg | ORAL_CAPSULE | Freq: Two times a day (BID) | ORAL | 1 refills | Status: DC

## 2021-02-28 NOTE — Progress Notes (Signed)
Patient doing well thsi AM. Passing flatus, voiding, tolerating PO. Needs to increase ambulating. Reviewed goals for home and precautions including lifting and sexual activity precautions. Will send with scheduled colace for constipation.  BP 103/63 (BP Location: Right Arm)   Pulse 82   Temp 98 F (36.7 C) (Oral)   Resp 15   Ht 5\' 4"  (1.626 m)   Wt 93 kg   LMP 02/21/2021 Comment: urine preg.negative.02/27/21  SpO2 99%   BMI 35.19 kg/m

## 2021-02-28 NOTE — Discharge Summary (Signed)
Physician Discharge Summary  Patient ID: Shelley Holden MRN: [id number] DOB/AGE: Aug 30, 1976 45 y.o.  Admit date: 02/27/2021 Discharge date: 02/28/2021  Admission Diagnoses:  Discharge Diagnoses:  Active Problems:   Abnormal uterine bleeding (AUB)   Discharged Condition: good  Hospital Course: Admitted for scheduled TLH/BS/cysto with vaginal morcellation for AUB-HMB and resulting chronic anemia. Please see operative note for full details. Received 2u pRBC intraop given starting Hgb of 6.8 without issue. By POD#1, ambulating, tolerating PO, voiding, pain controlled on PO meds. Discharged home in stable fashion with routine precautions   Consults: None  Discharge Exam: Blood pressure 103/63, pulse 82, temperature 98 F (36.7 C), temperature source Oral, resp. rate 15, height 5\' 4"  (1.626 m), weight 93 kg, last menstrual period 02/21/2021, SpO2 99 %.  General: alert, cooperative and no distress Lochia:normal flow Chest: CTAB Heart: RRR no m/r/g Abdomen: +BS, soft, nontender. Lap sites x2 CDI GU: peripad neg for VB Extremities: neg edema, neg calf TTP BL, neg Homans BL Disposition: Discharge disposition: 01-Home or Self Care      Discharge Instructions     Call MD for:   Complete by: As directed    Inability to urinate   Call MD for:  difficulty breathing, headache or visual disturbances   Complete by: As directed    Call MD for:  hives   Complete by: As directed    Call MD for:  persistant dizziness or light-headedness   Complete by: As directed    Call MD for:  persistant nausea and vomiting   Complete by: As directed    Call MD for:  redness, tenderness, or signs of infection (pain, swelling, redness, odor or green/yellow discharge around incision site)   Complete by: As directed    Call MD for:  severe uncontrolled pain   Complete by: As directed    Call MD for:  temperature >100.4   Complete by: As directed    Diet - low sodium heart healthy   Complete by: As  directed    Increase activity slowly   Complete by: As directed    Lifting restrictions   Complete by: As directed    Nothing more than 15lbs for 8wks   May walk up steps   Complete by: As directed    Sexual Activity Restrictions   Complete by: As directed    None until 8wk postop visit      Allergies as of 02/28/2021   No Known Allergies      Medication List     STOP taking these medications    ferrous sulfate 325 (65 FE) MG tablet       TAKE these medications    docusate sodium 100 MG capsule Commonly known as: COLACE Take 1 capsule (100 mg total) by mouth 2 (two) times daily.   ibuprofen 800 MG tablet Commonly known as: ADVIL Take 1 tablet (800 mg total) by mouth 3 (three) times daily. What changed:  when to take this reasons to take this   simethicone 80 MG chewable tablet Commonly known as: MYLICON Chew 1 tablet (80 mg total) by mouth 4 (four) times daily as needed for flatulence.         Signed: Melida Quitter Truda Staub 02/28/2021, 4:57 PM

## 2021-02-28 NOTE — Progress Notes (Signed)
POD #1  Subjective:  No acute events overnight.  Pt denies problems with ambulating, voiding or po intake.  She denies nausea or vomiting.  Pain is well controlled.  She has not had flatus. She has not had bowel movement. Denies vaginal bleeding or discharge. Appetite slowly increasing. Walked hallways yesterday evening and this morning without issue.   Objective: Blood pressure 127/68, pulse 75, temperature 97.7 F (36.5 C), temperature source Oral, resp. rate 17, height 5\' 4"  (1.626 m), weight 93 kg, last menstrual period 02/21/2021, SpO2 99 %.  Physical Exam:  General: alert, cooperative and no distress Lochia:normal flow Chest: CTAB Heart: RRR no m/r/g Abdomen: +BS, soft, nontender. Lap sites x2 CDI GU: peripad neg for VB Extremities: neg edema, neg calf TTP BL, neg Homans BL  Recent Labs    02/27/21 1317 02/28/21 0455  HGB 9.2* 7.7*  HCT 32.6* 25.7*    Assessment/Plan:  ASSESSMENT: Shelley Holden is a 45 y.o. G5P5 s/p TLH/BS/cyso/vaginal morcellation and lysis of adhesions for AUB-HMB. PMHx s/f h/o csx x4, chronic anemia.   -Continue PO pain meds -D/C Foley and pend spontaneous void -Encourage ambulation and incentive spirometry -Anticipate DC home this afternoon vs tomorrow AM  Reviewed pain goals and discharge expectations with patient. Encourage PO hydration as IV fluids discontinuing.  Chronic anemia - s/p 2u pRBC intraop, Hgb 6.8 > 9.2 > 7.7 (preop to immediate postop to this AM). WNL considering dilutional and equilibrating factors. Will likely DC with PO supps/constipation management   LOS: 1 day

## 2021-02-28 NOTE — Plan of Care (Signed)
Instructions were reviewed with patient. All questions were answered. Patient was transported to main entrance by wheelchair. ° °

## 2021-03-01 LAB — SURGICAL PATHOLOGY

## 2021-03-03 LAB — BPAM RBC
Blood Product Expiration Date: 202207122359
Blood Product Expiration Date: 202207172359
Blood Product Expiration Date: 202207172359
Blood Product Expiration Date: 202207192359
ISSUE DATE / TIME: 202206220659
ISSUE DATE / TIME: 202206220829
Unit Type and Rh: 7300
Unit Type and Rh: 7300
Unit Type and Rh: 7300
Unit Type and Rh: 7300

## 2021-03-03 LAB — TYPE AND SCREEN
ABO/RH(D): B POS
Antibody Screen: NEGATIVE
Unit division: 0
Unit division: 0
Unit division: 0
Unit division: 0

## 2021-10-12 ENCOUNTER — Encounter (HOSPITAL_COMMUNITY): Payer: Self-pay

## 2021-10-12 ENCOUNTER — Emergency Department (HOSPITAL_COMMUNITY): Payer: No Typology Code available for payment source

## 2021-10-12 ENCOUNTER — Other Ambulatory Visit: Payer: Self-pay

## 2021-10-12 ENCOUNTER — Emergency Department (HOSPITAL_COMMUNITY)
Admission: EM | Admit: 2021-10-12 | Discharge: 2021-10-12 | Disposition: A | Discharge status: Home / Self Care | Payer: No Typology Code available for payment source | Attending: Emergency Medicine | Admitting: Emergency Medicine

## 2021-10-12 DIAGNOSIS — M545 Low back pain, unspecified: Secondary | ICD-10-CM | POA: Insufficient documentation

## 2021-10-12 DIAGNOSIS — Y92410 Unspecified street and highway as the place of occurrence of the external cause: Secondary | ICD-10-CM | POA: Insufficient documentation

## 2021-10-12 DIAGNOSIS — M542 Cervicalgia: Secondary | ICD-10-CM | POA: Insufficient documentation

## 2021-10-12 DIAGNOSIS — M25511 Pain in right shoulder: Secondary | ICD-10-CM | POA: Insufficient documentation

## 2021-10-12 MED ORDER — LIDOCAINE 5 % EX PTCH: 1.0000 | MEDICATED_PATCH | CUTANEOUS | 0 refills | Status: DC

## 2021-10-12 MED ORDER — METHOCARBAMOL 500 MG PO TABS: 500.0000 mg | ORAL_TABLET | Freq: Two times a day (BID) | ORAL | 0 refills | Status: DC

## 2021-10-12 MED ORDER — HYDROCODONE-ACETAMINOPHEN 5-325 MG PO TABS
1.0000 | ORAL_TABLET | Freq: Once | ORAL | Status: AC
  Administered 2021-10-12: 1 via ORAL
  Filled 2021-10-12: qty 1

## 2021-10-12 MED ORDER — NAPROXEN 375 MG PO TABS: 375.0000 mg | ORAL_TABLET | Freq: Two times a day (BID) | ORAL | 0 refills | Status: DC
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
## 2023-07-01 ENCOUNTER — Emergency Department (HOSPITAL_COMMUNITY)
Admission: EM | Admit: 2023-07-01 | Discharge: 2023-07-02 | Disposition: A | Discharge status: Home / Self Care | Payer: 59 | Attending: Emergency Medicine | Admitting: Emergency Medicine

## 2023-07-01 ENCOUNTER — Encounter (HOSPITAL_COMMUNITY): Payer: Self-pay | Admitting: *Deleted

## 2023-07-01 ENCOUNTER — Other Ambulatory Visit: Payer: Self-pay

## 2023-07-01 DIAGNOSIS — M542 Cervicalgia: Secondary | ICD-10-CM | POA: Insufficient documentation

## 2023-07-01 DIAGNOSIS — Y92410 Unspecified street and highway as the place of occurrence of the external cause: Secondary | ICD-10-CM | POA: Diagnosis not present
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
## 2023-07-02 ENCOUNTER — Emergency Department (HOSPITAL_COMMUNITY): Payer: 59

## 2023-07-02 MED ORDER — CYCLOBENZAPRINE HCL 10 MG PO TABS
5.0000 mg | ORAL_TABLET | Freq: Once | ORAL | Status: AC
Start: 2023-07-02 — End: 2023-07-02
  Administered 2023-07-02: 5 mg via ORAL
  Filled 2023-07-02: qty 1

## 2023-07-02 MED ORDER — IBUPROFEN 400 MG PO TABS
600.0000 mg | ORAL_TABLET | Freq: Once | ORAL | Status: AC
  Administered 2023-07-02: 600 mg via ORAL
  Filled 2023-07-02: qty 1

## 2023-07-02 MED ORDER — IBUPROFEN 600 MG PO TABS: 600.0000 mg | ORAL_TABLET | Freq: Four times a day (QID) | ORAL | 0 refills | Status: AC | PRN

## 2023-07-02 NOTE — ED Notes (Signed)
Patient transported to CT 
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
## 2023-10-16 ENCOUNTER — Encounter (HOSPITAL_COMMUNITY): Payer: Self-pay

## 2023-10-16 ENCOUNTER — Ambulatory Visit (HOSPITAL_COMMUNITY)
Admission: RE | Admit: 2023-10-16 | Discharge: 2023-10-16 | Disposition: A | Discharge status: Home / Self Care | Payer: 59 | Source: Ambulatory Visit | Attending: Emergency Medicine | Admitting: Emergency Medicine

## 2023-10-16 VITALS — BP 131/82 | HR 100 | Temp 99.2°F | Resp 18

## 2023-10-16 DIAGNOSIS — J111 Influenza due to unidentified influenza virus with other respiratory manifestations: Secondary | ICD-10-CM | POA: Diagnosis not present

## 2023-10-16 DIAGNOSIS — M546 Pain in thoracic spine: Secondary | ICD-10-CM

## 2023-10-16 MED ORDER — KETOROLAC TROMETHAMINE 30 MG/ML IJ SOLN
INTRAMUSCULAR | Status: AC
  Filled 2023-10-16: qty 1

## 2023-10-16 MED ORDER — ACETAMINOPHEN 325 MG PO TABS
ORAL_TABLET | ORAL | Status: AC
  Filled 2023-10-16: qty 3

## 2023-10-16 MED ORDER — ACETAMINOPHEN 325 MG PO TABS
975.0000 mg | ORAL_TABLET | Freq: Once | ORAL | Status: AC
  Administered 2023-10-16: 975 mg via ORAL

## 2023-10-16 MED ORDER — CYCLOBENZAPRINE HCL 10 MG PO TABS
10.0000 mg | ORAL_TABLET | Freq: Two times a day (BID) | ORAL | 0 refills | Status: AC | PRN
Start: 2023-10-16 — End: ?

## 2023-10-16 MED ORDER — KETOROLAC TROMETHAMINE 30 MG/ML IJ SOLN
30.0000 mg | Freq: Once | INTRAMUSCULAR | Status: AC
  Administered 2023-10-16: 30 mg via INTRAMUSCULAR
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
## 2023-10-16 NOTE — Discharge Instructions (Addendum)
 A Toradol  injection was given today for pain. Please do not use any NSAIDs (ibuprofen /Advil , naproxen /Aleve , etc) for the next 12 hours. You can safely use tylenol  - which will also help control fever.  You can take the muscle relaxer (Flexeril ) twice daily. If the medication makes you drowsy, take only at bed time.  You may have several more days of flu-like symptoms. Drink lots of fluids!  Contact orthopedics to make follow up appointment regarding your back pain.
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
